# Patient Record
Sex: Male | Born: 1960 | Hispanic: No | Smoking: Current every day smoker
Health system: Southern US, Community
[De-identification: ages and names within clinical notes are randomized; demographics above are authoritative.]

## PROBLEM LIST (undated history)

## (undated) DIAGNOSIS — N529 Male erectile dysfunction, unspecified: Secondary | ICD-10-CM

## (undated) DIAGNOSIS — L930 Discoid lupus erythematosus: Secondary | ICD-10-CM

## (undated) DIAGNOSIS — L678 Other hair color and hair shaft abnormalities: Secondary | ICD-10-CM

## (undated) DIAGNOSIS — L738 Other specified follicular disorders: Secondary | ICD-10-CM

## (undated) DIAGNOSIS — R030 Elevated blood-pressure reading, without diagnosis of hypertension: Secondary | ICD-10-CM

## (undated) HISTORY — DX: Elevated blood-pressure reading, without diagnosis of hypertension: R03.0

## (undated) HISTORY — DX: Other specified follicular disorders: L73.8

## (undated) HISTORY — PX: NO PAST SURGERIES: SHX2092

## (undated) HISTORY — DX: Male erectile dysfunction, unspecified: N52.9

## (undated) HISTORY — DX: Discoid lupus erythematosus: L93.0

## (undated) HISTORY — DX: Other hair color and hair shaft abnormalities: L67.8

---

## 2010-02-06 ENCOUNTER — Ambulatory Visit: Payer: Self-pay | Admitting: Internal Medicine

## 2010-02-07 LAB — CONVERTED CEMR LAB
Albumin: 3.7 g/dL (ref 3.5–5.2)
Alkaline Phosphatase: 72 units/L (ref 39–117)
BUN: 13 mg/dL (ref 6–23)
Basophils Absolute: 0 10*3/uL (ref 0.0–0.1)
CO2: 26 meq/L (ref 19–32)
Calcium: 9.1 mg/dL (ref 8.4–10.5)
Cholesterol: 126 mg/dL (ref 0–200)
Creatinine, Ser: 1.1 mg/dL (ref 0.4–1.5)
Eosinophils Absolute: 0.2 10*3/uL (ref 0.0–0.7)
Glucose, Bld: 77 mg/dL (ref 70–99)
HDL: 47.8 mg/dL (ref >39.00)
Hemoglobin, Urine: NEGATIVE
Hemoglobin: 13.2 g/dL (ref 13.0–17.0)
Lymphocytes Relative: 28.9 % (ref 12.0–46.0)
MCHC: 34.6 g/dL (ref 30.0–36.0)
Neutro Abs: 2.9 10*3/uL (ref 1.4–7.7)
Nitrite: NEGATIVE
RDW: 13.1 % (ref 11.5–14.6)
Sodium: 139 meq/L (ref 135–145)
TSH: 1.72 microintl units/mL (ref 0.35–5.50)
Triglycerides: 43 mg/dL (ref 0.0–149.0)
Urobilinogen, UA: 0.2 (ref 0.0–1.0)

## 2010-02-09 ENCOUNTER — Encounter: Payer: Self-pay | Admitting: Internal Medicine

## 2010-02-09 ENCOUNTER — Ambulatory Visit: Payer: Self-pay | Admitting: Internal Medicine

## 2010-02-11 DIAGNOSIS — R030 Elevated blood-pressure reading, without diagnosis of hypertension: Secondary | ICD-10-CM

## 2010-02-11 DIAGNOSIS — I10 Essential (primary) hypertension: Secondary | ICD-10-CM | POA: Insufficient documentation

## 2010-02-11 DIAGNOSIS — N529 Male erectile dysfunction, unspecified: Secondary | ICD-10-CM

## 2010-03-08 ENCOUNTER — Telehealth: Payer: Self-pay | Admitting: Internal Medicine

## 2010-03-23 ENCOUNTER — Telehealth: Payer: Self-pay | Admitting: Internal Medicine

## 2010-04-25 ENCOUNTER — Telehealth: Payer: Self-pay | Admitting: Internal Medicine

## 2010-05-09 ENCOUNTER — Telehealth: Payer: Self-pay | Admitting: Internal Medicine

## 2010-06-07 ENCOUNTER — Telehealth: Payer: Self-pay | Admitting: Internal Medicine

## 2010-06-19 NOTE — Progress Notes (Signed)
Summary: Rx refill req  Phone Note Refill Request Message from:  Patient on April 25, 2010 3:09 PM  Refills Requested: Medication #1:  CIALIS 20 MG TABS 1/2-1 by mouth every 3 days as needed for sexual performance.   Dosage confirmed as above?Dosage Confirmed   Supply Requested: 3 months  Method Requested: Electronic Initial call taken by: Margaret Pyle, CMA,  April 25, 2010 3:09 PM  Follow-up for Phone Call        ok to fill as prev rx'd Follow-up by: Newt Lukes MD,  April 25, 2010 4:20 PM    Prescriptions: CIALIS 20 MG TABS (TADALAFIL) 1/2-1 by mouth every 3 days as needed for sexual performance  #3 x 0   Entered by:   Margaret Pyle, CMA   Authorized by:   Newt Lukes MD   Signed by:   Margaret Pyle, CMA on 04/25/2010   Method used:   [facility]        [facility]  [address] [phone]* (retail)       [address]       [place], [place]  [address]       Ph: [phone] or [phone]       Fax: [phone]   RxID:   [id number]

## 2010-06-19 NOTE — Progress Notes (Signed)
Summary: REFILL   Phone Note Call from Patient Call back at [facility] Phone [phone]   Summary of Call: Patient is requesting a call back regarding rx refills.  Initial call taken by: Lamar Sprinkles, CMA,  March 08, 2010 3:22 PM  Follow-up for Phone Call        Patient is requesting refill of cialis, [facility] [address] Follow-up by: Lamar Sprinkles, CMA,  March 08, 2010 5:21 PM  Additional Follow-up for Phone Call Additional follow up Details #1::        ok to fill as prev rx'd Additional Follow-up by: Newt Lukes MD,  March 09, 2010 9:45 AM    Additional Follow-up for Phone Call Additional follow up Details #2::    pt informed rx sent to pharmacy Follow-up by: Brenton Grills MA,  March 09, 2010 11:59 AM  Prescriptions: CIALIS 20 MG TABS (TADALAFIL) 1/2-1 by mouth every 3 days as needed for sexual performance  #3 x 0   Entered by:   Brenton Grills MA   Authorized by:   Newt Lukes MD   Signed by:   Brenton Grills MA on 03/09/2010   Method used:   [facility]        [facility]  [address] [phone][facility] (retail)       [address]       [place], [place]  [id number]       Ph: [phone] or [phone]       Fax: [phone]   RxID:   [id number]

## 2010-06-19 NOTE — Progress Notes (Signed)
  Phone Note Refill Request Message from:  Patient on March 23, 2010 2:51 PM  Refills Requested: Medication #1:  CIALIS 20 MG TABS 1/2-1 by mouth every 3 days as needed for sexual performance.   Dosage confirmed as above?Dosage Confirmed   Supply Requested: 3 months [facility] [address]   Method Requested: Electronic Initial call taken by: Margaret Pyle, CMA,  March 23, 2010 2:51 PM  Follow-up for Phone Call        ok to fill as prev rx'd Follow-up by: Newt Lukes MD,  March 23, 2010 3:06 PM    Prescriptions: CIALIS 20 MG TABS (TADALAFIL) 1/2-1 by mouth every 3 days as needed for sexual performance  #3 x 0   Entered by:   Margaret Pyle, CMA   Authorized by:   Newt Lukes MD   Signed by:   Margaret Pyle, CMA on 03/23/2010   Method used:   [facility]        [facility]  [address] [phone]* (retail)       [address]       [place], [place]  [address]       Ph: [phone] or [phone]       Fax: [phone]   RxID:   [id number]

## 2010-06-19 NOTE — Assessment & Plan Note (Signed)
Summary: NEW PT PHYSICAL--MED COST--#--PKG--STC   Vital Signs:  Patient profile:   50 year old male Height:      69 inches (175.26 cm) Weight:      180.8 pounds (82.18 kg) BMI:     26.80 O2 Sat:      98 % on Room air Temp:     99.7 degrees F (37.61 degrees C) oral Pulse rate:   76 / minute BP sitting:   142 / 78  (left arm) Cuff size:   regular  Vitals Entered By: Orlan Leavens RMA (February 09, 2010 3:12 PM)  O2 Flow:  Room air CC: New patient CPX Is Patient Diabetic? No Pain Assessment Patient in pain? no        Primary Care Provider:  Newt Lukes MD  CC:  New patient CPX.  History of Present Illness: new pt to me and our practice, here to est care patient is here today for annual physical. Patient feels well and has no complaints.   notes new ED - would like samples- cialis requested ED > 6 weeks, no new partners, no penile, no hx same  Preventive Screening-Counseling & Management  Alcohol-Tobacco     Alcohol drinks/day: <1     Alcohol Counseling: not indicated; use of alcohol is not excessive or problematic     Smoking Status: current     Tobacco Counseling: to quit use of tobacco products  Caffeine-Diet-Exercise     Diet Counseling: not indicated; diet is assessed to be healthy     Does Patient Exercise: yes     Exercise Counseling: not indicated; exercise is adequate     Depression Counseling: not indicated; screening negative for depression  Safety-Violence-Falls     Seat Belt Counseling: not indicated; patient wears seat belts     Helmet Counseling: not applicable     Firearm Counseling: not applicable     Violence Counseling: not indicated; no violence risk noted  Clinical Review Panels:  Prevention   Last PSA:  0.35 (02/06/2010)  Lipid Management   Cholesterol:  126 (02/06/2010)   LDL (bad choesterol):  70 (02/06/2010)   HDL (good cholesterol):  47.80 (02/06/2010)  CBC   WBC:  5.2 (02/06/2010)   RBC:  3.81 (02/06/2010)   Hgb:   13.2 (02/06/2010)   Hct:  38.1 (02/06/2010)   Platelets:  226.0 (02/06/2010)   MCV  100.0 (02/06/2010)   MCHC  34.6 (02/06/2010)   RDW  13.1 (02/06/2010)   PMN:  55.8 (02/06/2010)   Lymphs:  28.9 (02/06/2010)   Monos:  10.4 (02/06/2010)   Eosinophils:  4.5 (02/06/2010)   Basophil:  0.4 (02/06/2010)  Complete Metabolic Panel   Glucose:  77 (02/06/2010)   Sodium:  139 (02/06/2010)   Potassium:  3.9 (02/06/2010)   Chloride:  104 (02/06/2010)   CO2:  26 (02/06/2010)   BUN:  13 (02/06/2010)   Creatinine:  1.1 (02/06/2010)   Albumin:  3.7 (02/06/2010)   Total Protein:  6.7 (02/06/2010)   Calcium:  9.1 (02/06/2010)   Total Bili:  0.6 (02/06/2010)   Alk Phos:  72 (02/06/2010)   SGPT (ALT):  13 (02/06/2010)   SGOT (AST):  21 (02/06/2010)   Current Medications (verified): 1)  None  Allergies (verified): No Known Drug Allergies  Past History:  Past Medical History: Unremarkable  Past Surgical History: Denies surgical history  Family History: mom - 81s - hx PUD/GIB due to NSAIDS dad - expired 30y due to GSW (shot by highway patrol)  Social History: lives with mom and step dad - separated from wife since 2003 current smoker - cigars social alcohol useSmoking Status:  current Does Patient Exercise:  yes  Review of Systems       see HPI above. I have reviewed all other systems and they were negative.   Physical Exam  General:  alert, well-developed, well-nourished, and cooperative to examination.    Head:  Normocephalic and atraumatic without obvious abnormalities. No apparent alopecia or balding. Eyes:  vision grossly intact; pupils equal, round and reactive to light.  conjunctiva and lids normal.    Ears:  normal pinnae bilaterally, without erythema, swelling, or tenderness to palpation. TMs clear, without effusion, or cerumen impaction. Hearing grossly normal bilaterally  Mouth:  teeth and gums in good repair; mucous membranes moist, without lesions or ulcers.  oropharynx clear without exudate, no erythema.   Neck:  No deformities, masses, or tenderness noted. Lungs:  normal respiratory effort, no intercostal retractions or use of accessory muscles; normal breath sounds bilaterally - no crackles and no wheezes.    Heart:  normal rate, regular rhythm, no murmur, and no rub. BLE without edema. normal DP pulses and normal cap refill in all 4 extremities    Abdomen:  soft, non-tender, normal bowel sounds, no distention; no masses and no appreciable hepatomegaly or splenomegaly.   Rectal:  pt defers Genitalia:  pt defers Prostate:  pt defers Msk:  No deformity or scoliosis noted of thoracic or lumbar spine.   Neurologic:  alert & oriented X3 and cranial nerves II-XII symetrically intact.  strength normal in all extremities, sensation intact to light touch, and gait normal. speech fluent without dysarthria or aphasia; follows commands with good comprehension.  Skin:  no rashes, vesicles, ulcers, or erythema. No nodules or irregularity to palpation.  Psych:  Oriented X3, memory intact for recent and remote, normally interactive, good eye contact, not anxious appearing, not depressed appearing, and not agitated.      Impression & Recommendations:  Problem # 1:  PREVENTIVE HEALTH CARE (ICD-V70.0) Patient has been counseled on age-appropriate routine health concerns for screening and prevention. These are reviewed and up-to-date. Immunizations are up-to-date or declined. Labs and ECG reviewed. declines colo at this time Orders: EKG w/ Interpretation (93000)  Problem # 2:  ERECTILE DYSFUNCTION, ORGANIC (ICD-607.84) offered labs - pt declines testosterone level check screened for depression - negative trial cialis -to f/u if unimproved  His updated medication list for this problem includes:    Cialis 20 Mg Tabs (Tadalafil) .Marland Kitchen... 1/2-1 by mouth every 3 days as needed for sexual performance  Problem # 3:  ELEVATED BLOOD PRESSURE (ICD-796.2)  no known FH  HTN - advised on exercise, low Na and diet recheck BP 3-6 mo  BP today: 142/78  Labs Reviewed: Creat: 1.1 (02/06/2010) Chol: 126 (02/06/2010)   HDL: 47.80 (02/06/2010)   LDL: 70 (02/06/2010)   TG: 43.0 (02/06/2010)  Instructed in low sodium diet (DASH Handout) and behavior modification.    Complete Medication List: 1)  Cialis 20 Mg Tabs (Tadalafil) .... 1/2-1 by mouth every 3 days as needed for sexual performance  Patient Instructions: 1)  it was good to see you today. 2)  labs, EKG and exam look good today 3)  you should continue to cut back and stop smoking 4)  try 36h cialis as discussed - prescription and coupon given to you today -Please take as directed. Contact our office if you believe you're having problems with the medication(s).  5)  Check your Blood Pressure regularly. If it is above: 140/90 you should make an appointment. 6)  Limit your Sodium (Salt). 7)  Please schedule a follow-up appointment in Janurary 2012 to recheck blood pressure, call sooner if problems.  Prescriptions: CIALIS 20 MG TABS (TADALAFIL) 1/2-1 by mouth every 3 days as needed for sexual performance  #3 x 0   Entered and Authorized by:   Newt Lukes MD   Signed by:   Newt Lukes MD on 02/09/2010   Method used:   Print then Give to Patient   RxID:   [id number]

## 2010-06-21 NOTE — Progress Notes (Signed)
Summary: Rx refill req  Phone Note Refill Request Message from:  Patient on May 09, 2010 1:26 PM  Refills Requested: Medication #1:  CIALIS 20 MG TABS 1/2-1 by mouth every 3 days as needed for sexual performance.   Dosage confirmed as above?Dosage Confirmed   Supply Requested: 1 month  Method Requested: Electronic Initial call taken by: Steffan Caniglia Pyle, CMA,  May 09, 2010 1:27 PM  Follow-up for Phone Call        yes - refill now and give 6 refills on rx - thanks Follow-up by: Newt Lukes MD,  May 09, 2010 2:40 PM    Prescriptions: CIALIS 20 MG TABS (TADALAFIL) 1/2-1 by mouth every 3 days as needed for sexual performance  #3 x 6   Entered by:   Tayvon Culley Pyle, CMA   Authorized by:   Newt Lukes MD   Signed by:   Azia Toutant Pyle, CMA on 05/09/2010   Method used:   [facility]        [facility]  [address] [phone]* (retail)       [address]       [place], [place]  [id number]       Ph: [phone] or [phone]       Fax: [phone]   RxID:   [id number]

## 2010-06-21 NOTE — Progress Notes (Signed)
Summary: Rx refill req  Phone Note Refill Request Message from:  Patient on June 07, 2010 10:46 AM  Refills Requested: Medication #1:  CIALIS 20 MG TABS 1/2-1 by mouth every 3 days as needed for sexual performance.   Dosage confirmed as above?Dosage Confirmed   Supply Requested: 1 month  Method Requested: Electronic Initial call taken by: Margaret Pyle, CMA,  June 07, 2010 10:46 AM    Prescriptions: CIALIS 20 MG TABS (TADALAFIL) 1/2-1 by mouth every 3 days as needed for sexual performance  #3 Tablet x 2   Entered by:   Margaret Pyle, CMA   Authorized by:   Newt Lukes MD   Signed by:   Margaret Pyle, CMA on 06/07/2010   Method used:   [facility]        [facility]  [address] [phone]* (retail)       [address]       [place], [place]  [address]       Ph: [phone] or [phone]       Fax: [phone]   RxID:   [id number]

## 2010-06-22 ENCOUNTER — Telehealth: Payer: Self-pay | Admitting: Internal Medicine

## 2010-06-27 NOTE — Progress Notes (Signed)
Summary: Rx refill req  Phone Note Refill Request Message from:  Patient on June 22, 2010 12:55 PM  Refills Requested: Medication #1:  CIALIS 20 MG TABS 1/2-1 by mouth every 3 days as needed for sexual performance.   Dosage confirmed as above?Dosage Confirmed   Supply Requested: 3 months Pt is requesting Rx for #10 x 3   Method Requested: Electronic Initial call taken by: Margaret Pyle, CMA,  June 22, 2010 12:56 PM  Follow-up for Phone Call        ok to fill as requested - thanks Follow-up by: Newt Lukes MD,  June 22, 2010 1:14 PM    Prescriptions: CIALIS 20 MG TABS (TADALAFIL) 1/2-1 by mouth every 3 days as needed for sexual performance  #10 x 2   Entered by:   Margaret Pyle, CMA   Authorized by:   Newt Lukes MD   Signed by:   Margaret Pyle, CMA on 06/22/2010   Method used:   [facility]        [facility]  [address] [phone]* (retail)       [address]       [place], [place]  [address]       Ph: [phone] or [phone]       Fax: [phone]   RxID:   [id number]

## 2010-07-11 ENCOUNTER — Ambulatory Visit (INDEPENDENT_AMBULATORY_CARE_PROVIDER_SITE_OTHER): Payer: PRIVATE HEALTH INSURANCE | Admitting: Internal Medicine

## 2010-07-11 ENCOUNTER — Encounter: Payer: Self-pay | Admitting: Internal Medicine

## 2010-07-11 ENCOUNTER — Other Ambulatory Visit: Payer: Self-pay | Admitting: Internal Medicine

## 2010-07-11 ENCOUNTER — Other Ambulatory Visit: Payer: PRIVATE HEALTH INSURANCE

## 2010-07-11 DIAGNOSIS — R030 Elevated blood-pressure reading, without diagnosis of hypertension: Secondary | ICD-10-CM

## 2010-07-11 DIAGNOSIS — L738 Other specified follicular disorders: Secondary | ICD-10-CM | POA: Insufficient documentation

## 2010-07-11 DIAGNOSIS — N529 Male erectile dysfunction, unspecified: Secondary | ICD-10-CM

## 2010-07-17 NOTE — Assessment & Plan Note (Signed)
Summary: f/u appt--recheck blood pressure   Vital Signs:  Patient profile:   50 year old male Height:      69 inches (175.26 cm) Weight:      190.8 pounds (86.73 kg) O2 Sat:      98 % on Room air Temp:     98.4 degrees F (36.89 degrees C) oral Pulse rate:   55 / minute BP sitting:   112 / 70  (left arm) Cuff size:   large  Vitals Entered By: Orlan Leavens RMA (July 11, 2010 8:53 AM)  O2 Flow:  Room air CC: follow-up visit Is Patient Diabetic? No Pain Assessment Patient in pain? no        Primary Care Provider:  Newt Lukes MD  CC:  follow-up visit.  History of Present Illness: here for f/u -   elev BP last OV  - no hx same no recurrent problems  ED - continued problems with same would like samples- cialis requested and wants testosterone checked - ?vitamins no new partners, no penile lesions or discharge  also c/o scalp irritation - shaves scalp daily - inconsistent timing of razor change no drainage, no fever - no other skin lesions outside shaved area  Current Medications (verified): 1)  Cialis 20 Mg Tabs (Tadalafil) .... 1/2-1 By Mouth Every 3 Days As Needed For Sexual Performance 2)  Advil 200 Mg Tabs (Ibuprofen) .... Use As Needed 3)  Multivitamins  Tabs (Multiple Vitamin) .... Take 1 By Mouth Once Daily  Allergies (verified): No Known Drug Allergies  Past History:  Past Medical History: ED  Review of Systems  The patient denies fever, weight loss, chest pain, syncope, and headaches.         feels fatigued in past 1 mo  Physical Exam  General:  alert, well-developed, well-nourished, and cooperative to examination.    Lungs:  normal respiratory effort, no intercostal retractions or use of accessory muscles; normal breath sounds bilaterally - no crackles and no wheezes.    Heart:  normal rate, regular rhythm, no murmur, and no rub. BLE without edema. normal DP pulses and normal cap refill in all 4 extremities    Skin:  2 areas of mod  folliculitis on scalp over right frontal region and right psotauricle region - closely shaven, no ulceration, celluslits, fluctuence or closed pustules Psych:  Oriented X3, memory intact for recent and remote, normally interactive, good eye contact, not anxious appearing, not depressed appearing, and not agitated.      Impression & Recommendations:  Problem # 1:  FOLLICULITIS (ICD-704.8)  tx topical irritation with benzoyl gel - erx done instructed on need to change razor and keep washed  Orders: Prescription Created Electronically [phone])  Problem # 2:  ELEVATED BLOOD PRESSURE (ICD-796.2) normal BP today no known FH HTN - advised to cont exercise, low Na and diet recheck BP each OV and monitor same  Instructed in low sodium diet (DASH Handout) and behavior modification.    BP today: 112/70 Prior BP: 142/78 (02/09/2010)  Labs Reviewed: Creat: 1.1 (02/06/2010) Chol: 126 (02/06/2010)   HDL: 47.80 (02/06/2010)   LDL: 70 (02/06/2010)   TG: 43.0 (02/06/2010)  Instructed in low sodium diet (DASH Handout) and behavior modification.    Problem # 3:  ERECTILE DYSFUNCTION, ORGANIC (ICD-607.84)  His updated medication list for this problem includes:    Cialis 20 Mg Tabs (Tadalafil) .Marland Kitchen... 1/2-1 by mouth every 3 days as needed for sexual performance  Orders: TLB-Testosterone, Total (84403-TESTO)  testosterone level check today - tx if low screened for depression - negative cont cialis - f/u if unimproved  Complete Medication List: 1)  Cialis 20 Mg Tabs (Tadalafil) .... 1/2-1 by mouth every 3 days as needed for sexual performance 2)  Advil 200 Mg Tabs (Ibuprofen) .... Use as needed 3)  Multivitamins Tabs (Multiple vitamin) .... Take 1 by mouth once daily 4)  Benzoyl Peroxide 5 % Gel (Benzoyl peroxide) .... Apply two times a day to affected skin until clear  Patient Instructions: 1)  it was good to see you today. 2)  test(s) ordered today - your results will be called to you after  review in 48-72 hours from the time of test completion; if any changes need to be made or there are abnormal results, you will be notified at that time 3)  use antibioitc ointment to scalp irritation and use clean razors for shaving scalp to avoid further irritation 4)  Please schedule a follow-up appointment for physical and labs in Sept 2012, call sooner if problems.  Prescriptions: BENZOYL PEROXIDE 5 % GEL (BENZOYL PEROXIDE) apply two times a day to affected skin until clear  #1 x 1   Entered and Authorized by:   Newt Lukes MD   Signed by:   Newt Lukes MD on 07/11/2010   Method used:   Electronically to        [facility]  [address] [phone]* (retail)       [address]       [place], [place]  [address]       Ph: [phone] or [phone]       Fax: [phone]   RxID:   [id number]    Orders Added: 1)  TLB-Testosterone, Total [84403-TESTO] 2)  Est. Patient Level IV [27062] 3)  Prescription Created Electronically [id number]

## 2010-09-10 ENCOUNTER — Encounter: Payer: Self-pay | Admitting: Internal Medicine

## 2010-09-13 ENCOUNTER — Ambulatory Visit (INDEPENDENT_AMBULATORY_CARE_PROVIDER_SITE_OTHER): Payer: PRIVATE HEALTH INSURANCE | Admitting: Internal Medicine

## 2010-09-13 ENCOUNTER — Encounter: Payer: Self-pay | Admitting: Internal Medicine

## 2010-09-13 VITALS — BP 126/64 | HR 69 | Temp 98.0°F | Ht 69.0 in | Wt 188.8 lb

## 2010-09-13 DIAGNOSIS — L738 Other specified follicular disorders: Secondary | ICD-10-CM

## 2010-09-13 MED ORDER — MUPIROCIN 2 % EX OINT: TOPICAL_OINTMENT | CUTANEOUS | Status: AC

## 2010-09-13 MED ORDER — SULFAMETHOXAZOLE-TRIMETHOPRIM 800-160 MG PO TABS: 1.0000 | ORAL_TABLET | Freq: Two times a day (BID) | ORAL | Status: AC

## 2010-09-13 NOTE — Progress Notes (Signed)
  Subjective:    Patient ID: Kyle Morse, male    DOB: June 07, 1960, 50 y.o.   MRN: [id number]  HPI  here for new skin eruption Involves red bumps across chest Onset 3-4 weeks ago Hx similar eruption on scalp - improved after use of benzoyl gel but still few present on scalp Changes razors regularly associated with itch, few are tender -  "big ones" will become pustule like and rupture with pressure - then leave scar No fever No travel, new detergent or water exposure (lake, ocean or hottub)   Also reviewed chronic medical issues:  Hx elev BP but no HTN no recurrent problems No CP, HA or edema  ED - continued problems with same Improved cialis requested - reviewed prior labs: testosterone no new partners, no penile lesions or discharge   Past Medical History  Diagnosis Date  . ERECTILE DYSFUNCTION, ORGANIC   . ELEVATED BLOOD PRESSURE   . FOLLICULITIS      Review of Systems  Constitutional: Negative for fever and unexpected weight change.  Respiratory: Negative for cough.   Cardiovascular: Negative for leg swelling.  Neurological: Negative for syncope and headaches.       Objective:   Physical Exam  Constitutional: He is oriented to person, place, and time. He appears well-developed and well-nourished. No distress.  Cardiovascular: Normal rate, regular rhythm and normal heart sounds.   Pulmonary/Chest: Effort normal and breath sounds normal. No respiratory distress. He has no wheezes.  Neurological: He is alert and oriented to person, place, and time.  Skin:       Folliculitis changes on B anterior chest wall - fewer on scalp (scars from prior eruption present on arms)       Lab Results  Component Value Date   WBC 5.2 02/06/2010   HGB 13.2 02/06/2010   HCT 38.1* 02/06/2010   PLT 226.0 02/06/2010   CHOL 126 02/06/2010   TRIG 43.0 02/06/2010   HDL 47.80 02/06/2010   ALT 13 02/06/2010   AST 21 02/06/2010   NA 139 02/06/2010   K 3.9 02/06/2010   CL 104 02/06/2010   CREATININE 1.1 02/06/2010   BUN 13 02/06/2010   CO2 26 02/06/2010   TSH 1.72 02/06/2010   PSA 0.35 02/06/2010    Assessment & Plan:  Recurrent folliculitis - will treat with oral and topical abx given severity of current outbreak Reminded again to avoid regular shaving and change razor regularly

## 2010-09-13 NOTE — Patient Instructions (Signed)
It was good to see you today. Will use antibiotic pills and ointment as discussed to clear your skin infection - Your prescription(s) have been submitted to your pharmacy. Please take as directed and contact our office if you believe you are having problem(s) with the medication(s). Change razors regularly, use antibacterial soap and avoid drying skin Call if unimproved with treatment or sooner if other problems

## 2010-09-13 NOTE — Assessment & Plan Note (Signed)
Treat with septra and mupirocin topical - erx done

## 2010-10-02 ENCOUNTER — Telehealth: Payer: Self-pay

## 2010-10-02 DIAGNOSIS — L739 Follicular disorder, unspecified: Secondary | ICD-10-CM

## 2010-10-02 NOTE — Telephone Encounter (Signed)
Will refer to derm - thanks

## 2010-10-02 NOTE — Telephone Encounter (Signed)
Pt called stating that bumps on his chest and head have not improved and he has been doing everything as recommended. Pt is requesting referral to Derm for eval and treat, please advise

## 2010-10-02 NOTE — Telephone Encounter (Signed)
Pt advised and will expect a call from [facility] with appt info 

## 2010-11-19 ENCOUNTER — Encounter: Payer: Self-pay | Admitting: Internal Medicine

## 2011-03-08 ENCOUNTER — Other Ambulatory Visit: Payer: Self-pay

## 2011-03-08 MED ORDER — TADALAFIL 20 MG PO TABS: 20.0000 mg | ORAL_TABLET | Freq: Every day | ORAL | Status: DC | PRN

## 2011-11-01 ENCOUNTER — Other Ambulatory Visit: Payer: Self-pay | Admitting: Internal Medicine

## 2011-11-08 ENCOUNTER — Other Ambulatory Visit: Payer: Self-pay | Admitting: Internal Medicine

## 2012-06-08 ENCOUNTER — Emergency Department (HOSPITAL_COMMUNITY)
Admission: EM | Admit: 2012-06-08 | Discharge: 2012-06-08 | Disposition: A | Discharge status: Home / Self Care | Payer: PRIVATE HEALTH INSURANCE | Source: Home / Self Care | Attending: Family Medicine | Admitting: Family Medicine

## 2012-06-08 ENCOUNTER — Encounter (HOSPITAL_COMMUNITY): Payer: Self-pay | Admitting: *Deleted

## 2012-06-08 DIAGNOSIS — H109 Unspecified conjunctivitis: Secondary | ICD-10-CM

## 2012-06-08 MED ORDER — MOXIFLOXACIN HCL 0.5 % OP SOLN: 1.0000 [drp] | Freq: Three times a day (TID) | OPHTHALMIC | Status: DC

## 2012-06-08 NOTE — ED Provider Notes (Signed)
History     CSN: [id number]  Arrival date & time 06/08/12  4098   First MD Initiated Contact with Patient 06/08/12 1824      No chief complaint on file.   (Consider location/radiation/quality/duration/timing/severity/associated sxs/prior treatment) Patient is a 52 y.o. male presenting with eye problem. The history is provided by the patient.  Eye Problem  This is a new problem. The current episode started more than 2 days ago. The problem has been gradually worsening. The left eye is affected.There was no injury mechanism. The pain is mild. There is no history of trauma to the eye. There is no known exposure to pink eye. He does not wear contacts. Associated symptoms include foreign body sensation and eye redness. Pertinent negatives include no numbness, no blurred vision, no decreased vision, no discharge, no double vision, no photophobia and no nausea.    Past Medical History  Diagnosis Date  . ERECTILE DYSFUNCTION, ORGANIC   . ELEVATED BLOOD PRESSURE   . FOLLICULITIS     Past Surgical History  Procedure Date  . No past surgeries     No family history on file.  History  Substance Use Topics  . Smoking status: Current Every Day Smoker    Types: Cigars  . Smokeless tobacco: Not on file     Comment: separated from wife since 2003. lives with mom & step dad  . Alcohol Use: Yes     Comment: Social      Review of Systems  Constitutional: Negative.   HENT: Negative.   Eyes: Positive for redness. Negative for blurred vision, double vision, photophobia and discharge.  Gastrointestinal: Negative for nausea.  Neurological: Negative for numbness.    Allergies  Review of patient's allergies indicates no known allergies.  Home Medications   Current Outpatient Rx  Name  Route  Sig  Dispense  Refill  . BENZOYL PEROXIDE 5 % EX GEL   Topical   Apply topically 2 (two) times daily.           Marland Kitchen CIALIS 20 MG PO TABS      TAKE 1 TABLET (20 MG TOTAL) BY MOUTH DAILY AS  NEEDED.   10 tablet   0     Overdue for yearly physical must see md b4 additio ...   . IBUPROFEN 200 MG PO TABS   Oral   Take 200 mg by mouth as needed.           Marland Kitchen ONE-DAILY MULTI VITAMINS PO TABS   Oral   Take 1 tablet by mouth daily.             BP 144/84  Pulse 86  Temp 98.1 F (36.7 C) (Oral)  Resp 18  SpO2 98%  Physical Exam  Nursing note and vitals reviewed. Constitutional: He appears well-developed and well-nourished.  HENT:  Head: Normocephalic.  Right Ear: External ear normal.  Mouth/Throat: Oropharynx is clear and moist.  Eyes: EOM and lids are normal. Pupils are equal, round, and reactive to light. No foreign bodies found. Left conjunctiva is injected. Left conjunctiva has no hemorrhage.  Fundoscopic exam:      The left eye shows no exudate. The left eye shows venous pulsations. Slit lamp exam:      The left eye shows no fluorescein uptake.      ED Course  Procedures (including critical care time)  Labs Reviewed - No data to display No results found.   No diagnosis found.    MDM  Linna Hoff, MD 06/08/12 Jerene Bears

## 2012-06-08 NOTE — ED Notes (Signed)
Fri afternoon he started having left eye pain, red and swollen.  During the night he felt pressure building up in his eye and tearing up.  Saturday AM it was OK, it was still red and sensitive but not as bad.  Sunday afternoon he had photophobia and sensitive to the air.  Sun. night he had the same problem her had Fri. night.

## 2013-05-30 ENCOUNTER — Ambulatory Visit (INDEPENDENT_AMBULATORY_CARE_PROVIDER_SITE_OTHER): Payer: PRIVATE HEALTH INSURANCE | Admitting: Family Medicine

## 2013-05-30 ENCOUNTER — Ambulatory Visit: Payer: PRIVATE HEALTH INSURANCE

## 2013-05-30 VITALS — BP 120/72 | HR 90 | Temp 98.1°F | Resp 18 | Ht 68.0 in | Wt 202.0 lb

## 2013-05-30 DIAGNOSIS — T148XXA Other injury of unspecified body region, initial encounter: Secondary | ICD-10-CM

## 2013-05-30 DIAGNOSIS — R202 Paresthesia of skin: Secondary | ICD-10-CM

## 2013-05-30 DIAGNOSIS — M25531 Pain in right wrist: Secondary | ICD-10-CM

## 2013-05-30 DIAGNOSIS — M25539 Pain in unspecified wrist: Secondary | ICD-10-CM

## 2013-05-30 DIAGNOSIS — R209 Unspecified disturbances of skin sensation: Secondary | ICD-10-CM

## 2013-05-30 LAB — POCT GLYCOSYLATED HEMOGLOBIN (HGB A1C): Hemoglobin A1C: 5.3

## 2013-05-30 MED ORDER — MELOXICAM 7.5 MG PO TABS: 7.5000 mg | ORAL_TABLET | Freq: Two times a day (BID) | ORAL | Status: DC

## 2013-05-30 NOTE — Patient Instructions (Signed)
Wrist Pain Wrist injuries are frequent in adults and children. A sprain is an injury to the ligaments that hold your bones together. A strain is an injury to muscle or muscle cord-like structures (tendons) from stretching or pulling. Generally, when wrists are moderately tender to touch following a fall or injury, a break in the bone (fracture) may be present. Most wrist sprains or strains are better in 3 to 5 days, but complete healing may take several weeks. HOME CARE INSTRUCTIONS   Put ice on the injured area.  Put ice in a plastic bag.  Place a towel between your skin and the bag.  Leave the ice on for 15-20 minutes, 03-04 times a day, for the first 2 days.  Keep your arm raised above the level of your heart whenever possible to reduce swelling and pain.  Rest the injured area for at least 48 hours or as directed by your caregiver.  If a splint or elastic bandage has been applied, use it for as long as directed by your caregiver or until seen by a caregiver for a follow-up exam.  Only take over-the-counter or prescription medicines for pain, discomfort, or fever as directed by your caregiver.  Keep all follow-up appointments. You may need to follow up with a specialist or have follow-up X-rays. Improvement in pain level is not a guarantee that you did not fracture a bone in your wrist. The only way to determine whether or not you have a broken bone is by X-ray. SEEK IMMEDIATE MEDICAL CARE IF:   Your fingers are swollen, very red, white, or cold and blue.  Your fingers are numb or tingling.  You have increasing pain.  You have difficulty moving your fingers. MAKE SURE YOU:   Understand these instructions.  Will watch your condition.  Will get help right away if you are not doing well or get worse. Document Released: 02/13/2005 Document Revised: 07/29/2011 Document Reviewed: 06/27/2010 [employer] Patient Information 2014 [employer].  

## 2013-05-30 NOTE — Progress Notes (Signed)
Chief Complaint:  Chief Complaint  Patient presents with  . wrist pain and numbness in finger    right-x1 week last night worse     HPI: Kyle Morse is a 53 y.o. male who is here for right sided wrist soreness. The pain radiates up to his right shoulder. It is in both wrists. At night he feels some numbness and tingling in his fingers. He states that some of his fingers on his right hand are swollen also. His symptoms started about a week ago and has been getting worse since then. Last night the pain was at its worst. 8/10. He takes Advil with some relief but not much. 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 He also has experienced some weakness in his hands. Throbbing aching sharp pain, wakes him up.   Past Medical History  Diagnosis Date  . ERECTILE DYSFUNCTION, ORGANIC   . ELEVATED BLOOD PRESSURE   . FOLLICULITIS    Past Surgical History  Procedure Laterality Date  . No past surgeries     History   Social History  . Marital Status: Legally Separated    Spouse Name: N/A    Number of Children: N/A  . Years of Education: N/A   Social History Main Topics  . Smoking status: Current Every Day Smoker -- 1.00 packs/day    Types: Cigars  . Smokeless tobacco: None     Comment: separated from wife since 2003. lives with mom & step dad  . Alcohol Use: 16.8 oz/week    14 Cans of beer, 14 Shots of liquor per week     Comment: Social  . Drug Use: No  . Sexual Activity:    Other Topics Concern  . None   Social History Narrative  . None   History reviewed. No pertinent family history. No Known Allergies Prior to Admission medications   Medication Sig Start Date End Date Taking? Authorizing Provider  ibuprofen (ADVIL,MOTRIN) 200 MG tablet Take 200 mg by mouth as needed.     Yes Historical Provider, MD  Multiple Vitamin (MULTIVITAMIN) tablet Take 1 tablet by mouth daily.     Yes Historical Provider, MD  benzoyl peroxide 5 % gel Apply  topically 2 (two) times daily.      Historical Provider, MD  CIALIS 20 MG tablet TAKE 1 TABLET (20 MG TOTAL) BY MOUTH DAILY AS NEEDED. 11/01/11   Newt LukesValerie A Leschber, MD  moxifloxacin (VIGAMOX) 0.5 % ophthalmic solution Place 1 drop into the left eye 3 (three) times daily. After warm soak to eye. 06/08/12   Linna HoffJames D Kindl, MD     ROS: The patient denies fevers, chills, night sweats, unintentional weight loss, chest pain, palpitations, wheezing, dyspnea on exertion, nausea, vomiting, abdominal pain, dysuria, hematuria, melena  All other systems have been reviewed and were otherwise negative with the exception of those mentioned in the HPI and as above.    PHYSICAL EXAM: Filed Vitals:   05/30/13 1201  BP: 120/72  Pulse: 90  Temp: 98.1 F (36.7 C)  Resp: 18   Filed Vitals:   05/30/13 1201  Height: 5\' 8"  (1.727 m)  Weight: 202 lb (91.627 kg)   Body mass index is 30.72 kg/(m^2).  General: Alert, no acute distress HEENT:  Normocephalic, atraumatic, oropharynx patent. EOMI, PERRLA Cardiovascular:  Regular rate and rhythm, no rubs murmurs or gallops.  No Carotid bruits, radial pulse intact. No pedal edema.  Respiratory: Clear  to auscultation bilaterally.  No wheezes, rales, or rhonchi.  No cyanosis, no use of accessory musculature GI: No organomegaly, abdomen is soft and non-tender, positive bowel sounds.  No masses. Skin: No rashes. Neurologic: Facial musculature symmetric. Psychiatric: Patient is appropriate throughout our interaction. Lymphatic: No cervical lymphadenopathy Musculoskeletal: Gait intact. Right neck-normal, neg spurling Right shoulder-neg Hawkin/Neers, full ROM Right wrist-full ROM, sensation intact. 5/5 strength, + Phalens, + tinels, Neg Dequervain     LABS: Results for orders placed in visit on 05/30/13  POCT GLYCOSYLATED HEMOGLOBIN (HGB A1C)      Result Value Range   Hemoglobin A1C 5.3       EKG/XRAY:   Primary read interpreted by Dr. Conley Rolls at [facility]. No  fx/dislocation   ASSESSMENT/PLAN: Encounter Diagnoses  Name Primary?  . Wrist pain, right Yes  . Paresthesias in right hand   . 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 NKI.  I think it might be strain and strain with possible  carpal tunnel component from overuse and repetitive motion. He does not have diabetes. He is a smoker. Denies PAD .  Rx mobic and wrist brace He will see his PCP this Friday , advise to let her know if  no improvement then consider steroid taper or tramadol F/u prn Gross sideeffects, risk and benefits, and alternatives of medications d/w patient. Patient is aware that all medications have potential sideeffects and we are unable to predict every sideeffect or drug-drug interaction that may occur.  LE, [name], DO 05/30/2013 1:21 PM   05/31/13--Spoke with patient, he had pain that woke him up from sleep, he was wearing his wrist brace. Advise to stop wearing wrist brace at night, use just when working. I rx him tramadol since he is having more pain. D/w pt risks and benefits.  Deneis numbness/weakness or tingling. He will F/u with Dr Rene Paci on Friday.

## 2013-05-31 ENCOUNTER — Telehealth: Payer: Self-pay

## 2013-05-31 MED ORDER — TRAMADOL HCL 50 MG PO TABS: 50.0000 mg | ORAL_TABLET | Freq: Every evening | ORAL | Status: DC | PRN

## 2013-05-31 NOTE — Telephone Encounter (Signed)
Patient states that if the pain in his wrist has not improved he was instructed to call back to get a pain medicaiton. [facility] [address]  [phone]

## 2013-05-31 NOTE — Telephone Encounter (Signed)
In OV states pt may try Tramadol or Steroid taper if not feeling better. Pain has been unchanged from visit yesterday. Please advise.

## 2013-06-01 NOTE — Telephone Encounter (Signed)
States that the Tramadol is not working for him. States that he woke up at 2:30am with is fingers throbbing.  [phone]

## 2013-06-02 NOTE — Telephone Encounter (Signed)
Pt is going to try taking Advil in conjunction with Tramadol to help with the pain relief. He has a dr appt for follow up on Friday.

## 2013-06-04 ENCOUNTER — Encounter: Payer: Self-pay | Admitting: Internal Medicine

## 2013-06-04 ENCOUNTER — Other Ambulatory Visit (INDEPENDENT_AMBULATORY_CARE_PROVIDER_SITE_OTHER): Payer: PRIVATE HEALTH INSURANCE

## 2013-06-04 ENCOUNTER — Ambulatory Visit (INDEPENDENT_AMBULATORY_CARE_PROVIDER_SITE_OTHER): Payer: PRIVATE HEALTH INSURANCE | Admitting: Family Medicine

## 2013-06-04 ENCOUNTER — Encounter: Payer: Self-pay | Admitting: Family Medicine

## 2013-06-04 ENCOUNTER — Ambulatory Visit (INDEPENDENT_AMBULATORY_CARE_PROVIDER_SITE_OTHER): Payer: PRIVATE HEALTH INSURANCE | Admitting: Internal Medicine

## 2013-06-04 VITALS — BP 112/70 | HR 87 | Temp 97.7°F | Resp 16 | Wt 203.0 lb

## 2013-06-04 VITALS — BP 112/70 | HR 87 | Temp 97.7°F | Ht 69.0 in | Wt 203.0 lb

## 2013-06-04 DIAGNOSIS — M79641 Pain in right hand: Secondary | ICD-10-CM

## 2013-06-04 DIAGNOSIS — M79609 Pain in unspecified limb: Secondary | ICD-10-CM

## 2013-06-04 DIAGNOSIS — G5600 Carpal tunnel syndrome, unspecified upper limb: Secondary | ICD-10-CM

## 2013-06-04 DIAGNOSIS — M25539 Pain in unspecified wrist: Secondary | ICD-10-CM

## 2013-06-04 DIAGNOSIS — M25531 Pain in right wrist: Secondary | ICD-10-CM

## 2013-06-04 MED ORDER — GABAPENTIN 300 MG PO CAPS: ORAL_CAPSULE | ORAL | Status: DC

## 2013-06-04 MED ORDER — MELOXICAM 15 MG PO TABS: 15.0000 mg | ORAL_TABLET | Freq: Every day | ORAL | Status: DC

## 2013-06-04 MED ORDER — KETOROLAC TROMETHAMINE 60 MG/2ML IM SOLN
60.0000 mg | Freq: Once | INTRAMUSCULAR | Status: AC
  Administered 2013-06-04: 60 mg via INTRAMUSCULAR

## 2013-06-04 NOTE — Progress Notes (Signed)
Pre-visit discussion using our clinic review tool. No additional management support is needed unless otherwise documented below in the visit note.  

## 2013-06-04 NOTE — Progress Notes (Signed)
I'm seeing this patient by the request  of:  Rene PaciValerie Leschber, MD   CC: Right wrist pain  HPI: Patient is a very pleasant 53 year old gentleman is coming in with multiple months duration of right wrist pain. 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 Patient states that he is actually having more numbness in points to more of the median nerve distribution. Patient states he is also having swelling of his hand which is new. Patient denies any weakness but states that the pain can wake him up at night. Patient rates the pain as 9/10 in severity when it does hurt. Patient has an urgent care and was given a brace he was going to attempt to wear at night but he is unfortunately unable to because he states that it causes a throbbing sensation. Patient has been told by other individuals such as friends that he would need surgery. Patient is here for evaluation and management if possible.   Past medical, surgical, family and social history reviewed. Medications reviewed all in the electronic medical record.   Review of Systems: No headache, visual changes, nausea, vomiting, diarrhea, constipation, dizziness, abdominal pain, skin rash, fevers, chills, night sweats, weight loss, swollen lymph nodes, body aches, joint swelling, muscle aches, chest pain, shortness of breath, mood changes.   Objective:    Blood pressure 112/70, pulse 87, temperature 97.7 F (36.5 C), temperature source Oral, resp. rate 16, weight 203 lb (92.08 kg), SpO2 98.00%.   General: No apparent distress alert and oriented x3 mood and affect normal, dressed appropriately.  HEENT: Pupils equal, extraocular movements intact Respiratory: Patient's speak in full sentences and does not appear short of breath Cardiovascular: No lower extremity edema, non tender, no erythema Skin: Warm dry intact with no signs of infection or rash on extremities or on axial skeleton. Abdomen: Soft nontender Neuro: Cranial nerves II  through XII are intact, neurovascularly intact in all extremities with 2+ DTRs and 2+ pulses. Lymph: No lymphadenopathy of posterior or anterior cervical chain or axillae bilaterally.  Gait normal with good balance and coordination.  MSK: Non tender with full range of motion and good stability and symmetric strength and tone of shoulders, elbows, hip, knee and ankles bilaterally.  Wrist: Right Inspection shows the patient does have some swelling of this right hand compared to the contralateral side. ROM smooth and normal with good flexion and extension and ulnar/radial deviation that is symmetrical with opposite wrist. Palpation is normal over metacarpals, navicular, lunate, and TFCC; tendons without tenderness/ swelling No snuffbox tenderness. No tenderness over Canal of Guyon. Strength 5/5 in all directions without pain. Negative Finkelstein,  POSITIVE tinel's and phalens. Negative Watson's test.  MSK US performed of: Right wrist This study was ordered, performed, and interpreted by Terrilee FilesZach Smith D.O.  Wrist: All extensor compartments visualized and tendons all normal in appearance without fraying, tears, or sheath effusions. No effusion seen. TFCC intact. Scapholunate ligament intact. Carpal tunnel visualized but does have significant scarring of the nerve sheath surrounding the area and does have an area of 2.09 cm  Power doppler signal normal.  IMPRESSION:  Carpal tunnel syndrome  After verbal consent patient was prepped with alcohol swabs and then with a 25-gauge 5 inch inch needle was injected with 1 cc of 0.5% Marcaine and 1 cc of Kenalog 40 mg/dL. This was done under ultrasound guidance and needle placement was correct. Patient tolerated the procedure well and did have numbness but did have complete resolution of pain..Marland Kitchen  Impression and Recommendations:     This case required medical decision making of moderate complexity.

## 2013-06-04 NOTE — Patient Instructions (Signed)
Very nice to meet you If you can wear brace day and night for next 2 weeks that would be ideal Meloxicam daily for 10 days then as needed Gabapentin nightly Exercises most days of the week.  Come back in 2 weeks.

## 2013-06-04 NOTE — Patient Instructions (Signed)

## 2013-06-04 NOTE — Progress Notes (Signed)
   Subjective:    Patient ID: Kyle Morse, male    DOB: [birth year], 53 y.o.   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 ERECTILE DYSFUNCTION, ORGANIC   . ELEVATED BLOOD PRESSURE   . FOLLICULITIS     Review of Systems  Constitutional: Negative for fever and fatigue.  Musculoskeletal: Negative for arthralgias, joint swelling and myalgias.  Neurological: Positive for weakness (right hand). Negative for tremors and facial asymmetry.       Objective:   Physical Exam BP 112/70  Pulse 87  Temp(Src) 97.7 F (36.5 C) (Oral)  Ht 5\' 9"  (1.753 m)  Wt 203 lb (92.08 kg)  BMI 29.96 kg/m2  SpO2 98% Wt Readings from Last 3 Encounters:  06/04/13 203 lb (92.08 kg)  05/30/13 202 lb (91.627 kg)  09/13/10 188 lb 12.8 oz (85.639 kg)   Gen: uncomfortable but no acute distress MSkel: soft tissue swelling of right hand namely in thumb, index and third finger. No synovitis over PIP/DIP MCP or wrist. Full range of motion with decreased strength of hand grip right compared to left. No atrophy or other gross deformity Neuro - decreased grip strength hand right compared to left.  Lab Results  Component Value Date   WBC 5.2 02/06/2010   HGB 13.2 02/06/2010   HCT 38.1* 02/06/2010   PLT 226.0 02/06/2010   GLUCOSE 77 02/06/2010   CHOL  126 02/06/2010   TRIG 43.0 02/06/2010   HDL 47.80 02/06/2010   LDLCALC 70 02/06/2010   ALT 13 02/06/2010   AST 21 02/06/2010   NA 139 02/06/2010   K 3.9 02/06/2010   CL 104 02/06/2010   CREATININE 1.1 02/06/2010   BUN 13 02/06/2010   CO2 26 02/06/2010   TSH 1.72 02/06/2010   PSA 0.35 02/06/2010   HGBA1C 5.3 05/30/2013      Assessment & Plan:  Severe right hand pain x2 weeks Symptoms worse at night awaking with exacerbation of pain and numbness in median distribution Now with neurogenic swelling over thumb, index and middle finger  Toradol 60 mg IM for acute pain and anti-inflammatory Arrange for US eval today by sports med to consider steroid injection as patient is reluctant to consider surgery Patient educated on nature of carpal tunnel and importance of wearing wrist brace, especially at night  Spoke in person with Dr. Ayesha MohairZack Smith who will see patient and treat today as outlined

## 2013-06-04 NOTE — Addendum Note (Signed)
Addended by: Rene PaciLESCHBER, Tiffanie Blassingame A on: 06/04/2013 01:13 PM   Modules accepted: Orders

## 2013-06-04 NOTE — Assessment & Plan Note (Signed)
oInjected today.  Icing rotocol Wrist brace 23 hours a day for next 2 weeks.  Medications per orders. Patient will followup in 3 weeks for further evaluation. At that time if he is doing better we will do a bracing only at night. He can avoid nerve conduction study at this time.

## 2013-06-16 ENCOUNTER — Encounter: Payer: Self-pay | Admitting: Internal Medicine

## 2013-06-16 ENCOUNTER — Ambulatory Visit (INDEPENDENT_AMBULATORY_CARE_PROVIDER_SITE_OTHER)
Admission: RE | Admit: 2013-06-16 | Discharge: 2013-06-16 | Disposition: A | Discharge status: Home / Self Care | Payer: PRIVATE HEALTH INSURANCE | Source: Ambulatory Visit | Attending: Internal Medicine | Admitting: Internal Medicine

## 2013-06-16 ENCOUNTER — Ambulatory Visit (INDEPENDENT_AMBULATORY_CARE_PROVIDER_SITE_OTHER): Payer: PRIVATE HEALTH INSURANCE | Admitting: Internal Medicine

## 2013-06-16 VITALS — BP 128/80 | HR 98 | Temp 98.2°F

## 2013-06-16 DIAGNOSIS — M7541 Impingement syndrome of right shoulder: Secondary | ICD-10-CM

## 2013-06-16 DIAGNOSIS — M7580 Other shoulder lesions, unspecified shoulder: Secondary | ICD-10-CM

## 2013-06-16 DIAGNOSIS — M25819 Other specified joint disorders, unspecified shoulder: Secondary | ICD-10-CM

## 2013-06-16 DIAGNOSIS — G5600 Carpal tunnel syndrome, unspecified upper limb: Secondary | ICD-10-CM

## 2013-06-16 MED ORDER — HYDROCODONE-ACETAMINOPHEN 5-325 MG PO TABS: 1.0000 | ORAL_TABLET | Freq: Every evening | ORAL | Status: DC | PRN

## 2013-06-16 MED ORDER — MELOXICAM 15 MG PO TABS: 15.0000 mg | ORAL_TABLET | Freq: Every day | ORAL | Status: DC

## 2013-06-16 NOTE — Progress Notes (Signed)
Pre-visit discussion using our clinic review tool. No additional management support is needed unless otherwise documented below in the visit note.  

## 2013-06-16 NOTE — Progress Notes (Signed)
   Subjective:    Patient ID: Kyle Morse, male    DOB: 1961-01-11, 53 y.o.   MRN: [id number]  HPI  Here for follow up R hand and shoulder pain Interval hx reviewed Pain uncontrolled at night in shoulder but somewhat improved in R hand since injection  Past Medical History  Diagnosis Date  . ERECTILE DYSFUNCTION, ORGANIC   . ELEVATED BLOOD PRESSURE   . FOLLICULITIS     Review of Systems  Constitutional: Negative for fever and fatigue.  Musculoskeletal: Negative for arthralgias, joint swelling and myalgias.  Neurological: Positive for weakness (right hand). Negative for tremors and facial asymmetry.       Objective:   Physical Exam BP 128/80  Pulse 98  Temp(Src) 98.2 F (36.8 C) (Oral)  SpO2 95% Wt Readings from Last 3 Encounters:  06/04/13 203 lb (92.08 kg)  06/04/13 203 lb (92.08 kg)  05/30/13 202 lb (91.627 kg)   Gen: uncomfortable but no acute distress MSkel: improved soft tissue swelling of right hand namely in thumb, index and third finger. No synovitis over PIP/DIP MCP or wrist. Full range of motion with decreased strength of hand grip right compared to left. No atrophy or other gross deformity R Shoulder: Full range of motion. Neurovascularly intact distally. Good strength with stress of rotator cuff but causes pain. Positive impingement signs. Neuro - decreased grip strength hand right compared to left.  Lab Results  Component Value Date   WBC 5.2 02/06/2010   HGB 13.2 02/06/2010   HCT 38.1* 02/06/2010   PLT 226.0 02/06/2010   GLUCOSE 77 02/06/2010   CHOL 126 02/06/2010   TRIG 43.0 02/06/2010   HDL 47.80 02/06/2010   LDLCALC 70 02/06/2010   ALT 13 02/06/2010   AST 21 02/06/2010   NA 139 02/06/2010   K 3.9 02/06/2010   CL 104 02/06/2010   CREATININE 1.1 02/06/2010   BUN 13 02/06/2010   CO2 26 02/06/2010   TSH 1.72 02/06/2010   PSA 0.35 02/06/2010   HGBA1C 5.3 05/30/2013      Assessment & Plan:   Severe R CTS -  improved s/p steroid injection by sport med 2 weeks  ago Stable nocturnal symptoms using brace  R shoulder impingement - night awaking with exacerbation Check plain film to exclude bone spur Good strength on exam, but concern for impingement versus possible tear Given strengthening exercises for home PT, to continue anti-inflammatories  Followup with sports medicine on both issues in 2 weeks if unimproved, call sooner if worse  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 Patient will consider and call if work note needed

## 2013-06-16 NOTE — Patient Instructions (Addendum)
It was good to see you today.  We have reviewed your prior records including labs and tests today  Xray R shoulder ordered today. Your results will be released to MyChart (or called to you) after review, usually within 72hours after test completion. If any changes need to be made, you will be notified at that same time.  Continue meloxicam every day for shoulder and wrist pain, continue night time wrist splint for support  If shoulder or hand symptoms unimproved and another 2 weeks, please followup with Dr. Ayesha Mohair for further evaluation and treatment as needed  Impingement Syndrome, Rotator Cuff, Bursitis with Rehab Impingement syndrome is a condition that involves inflammation of the tendons of the rotator cuff and the subacromial bursa, that causes pain in the shoulder. The rotator cuff consists of four tendons and muscles that control much of the shoulder and upper arm function. The subacromial bursa is a fluid filled sac that helps reduce friction between the rotator cuff and one of the bones of the shoulder (acromion). Impingement syndrome is usually an overuse injury that causes swelling of the bursa (bursitis), swelling of the tendon (tendonitis), and/or a tear of the tendon (strain). Strains are classified into three categories. Grade 1 strains cause pain, but the tendon is not lengthened. Grade 2 strains include a lengthened ligament, due to the ligament being stretched or partially ruptured. With grade 2 strains there is still function, although the function may be decreased. Grade 3 strains include a complete tear of the tendon or muscle, and function is usually impaired. SYMPTOMS   Pain around the shoulder, often at the outer portion of the upper arm.  Pain that gets worse with shoulder function, especially when reaching overhead or lifting.  Sometimes, aching when not using the arm.  Pain that wakes you up at night.  Sometimes, tenderness, swelling, warmth, or redness over the  affected area.  Loss of strength.  Limited motion of the shoulder, especially reaching behind the back (to the back pocket or to unhook bra) or across your body.  Crackling sound (crepitation) when moving the arm.  Biceps tendon pain and inflammation (in the front of the shoulder). Worse when bending the elbow or lifting. CAUSES  Impingement syndrome is often an overuse injury, in which chronic (repetitive) motions cause the tendons or bursa to become inflamed. A strain occurs when a force is paced on the tendon or muscle that is greater than it can withstand. Common mechanisms of injury include: Stress from sudden increase in duration, frequency, or intensity of training.  Direct hit (trauma) to the shoulder.  Aging, erosion of the tendon with normal use.  Bony bump on shoulder (acromial spur). RISK INCREASES WITH:  Contact sports (football, wrestling, boxing).  Throwing sports (baseball, tennis, volleyball).  Weightlifting and bodybuilding.  Heavy labor.  Previous injury to the rotator cuff, including impingement.  Poor shoulder strength and flexibility.  Failure to warm up properly before activity.  Inadequate protective equipment.  Old age.  Bony bump on shoulder (acromial spur). PREVENTION   Warm up and stretch properly before activity.  Allow for adequate recovery between workouts.  Maintain physical fitness:  Strength, flexibility, and endurance.  Cardiovascular fitness.  Learn and use proper exercise technique. PROGNOSIS  If treated properly, impingement syndrome usually goes away within 6 weeks. Sometimes surgery is required.  RELATED COMPLICATIONS   Longer healing time if not properly treated, or if not given enough time to heal.  Recurring symptoms, that result in a chronic  condition.  Shoulder stiffness, frozen shoulder, or loss of motion.  Rotator cuff tendon tear.  Recurring symptoms, especially if activity is resumed too soon, with  overuse, with a direct blow, or when using poor technique. TREATMENT  Treatment first involves the use of ice and medicine, to reduce pain and inflammation. The use of strengthening and stretching exercises may help reduce pain with activity. These exercises may be performed at home or with a therapist. If non-surgical treatment is unsuccessful after more than 6 months, surgery may be advised. After surgery and rehabilitation, activity is usually possible in 3 months.  MEDICATION  If pain medicine is needed, nonsteroidal anti-inflammatory medicines (aspirin and ibuprofen), or other minor pain relievers (acetaminophen), are often advised.  Do not take pain medicine for 7 days before surgery.  Prescription pain relievers may be given, if your caregiver thinks they are needed. Use only as directed and only as much as you need.  Corticosteroid injections may be given by your caregiver. These injections should be reserved for the most serious cases, because they may only be given a certain number of times. HEAT AND COLD  Cold treatment (icing) should be applied for 10 to 15 minutes every 2 to 3 hours for inflammation and pain, and immediately after activity that aggravates your symptoms. Use ice packs or an ice massage.  Heat treatment may be used before performing stretching and strengthening activities prescribed by your caregiver, physical therapist, or athletic trainer. Use a heat pack or a warm water soak. SEEK MEDICAL CARE IF:   Symptoms get worse or do not improve in 4 to 6 weeks, despite treatment.  New, unexplained symptoms develop. (Drugs used in treatment may produce side effects.) EXERCISES  RANGE OF MOTION (ROM) AND STRETCHING EXERCISES - Impingement Syndrome (Rotator Cuff  Tendinitis, Bursitis) These exercises may help you when beginning to rehabilitate your injury. Your symptoms may go away with or without further involvement from your physician, physical therapist or athletic  trainer. While completing these exercises, remember:   Restoring tissue flexibility helps normal motion to return to the joints. This allows healthier, less painful movement and activity.  An effective stretch should be held for at least 30 seconds.  A stretch should never be painful. You should only feel a gentle lengthening or release in the stretched tissue. STRETCH  Flexion, Standing  Stand with good posture. With an underhand grip on your right / left hand, and an overhand grip on the opposite hand, grasp a broomstick or cane so that your hands are a little more than shoulder width apart.  Keeping your right / left elbow straight and shoulder muscles relaxed, push the stick with your opposite hand, to raise your right / left arm in front of your body and then overhead. Raise your arm until you feel a stretch in your right / left shoulder, but before you have increased shoulder pain.  Try to avoid shrugging your right / left shoulder as your arm rises, by keeping your shoulder blade tucked down and toward your mid-back spine. Hold for __________ seconds.  Slowly return to the starting position. Repeat __________ times. Complete this exercise __________ times per day. STRETCH  Abduction, Supine  Lie on your back. With an underhand grip on your right / left hand and an overhand grip on the opposite hand, grasp a broomstick or cane so that your hands are a little more than shoulder width apart.  Keeping your right / left elbow straight and your shoulder muscles  relaxed, push the stick with your opposite hand, to raise your right / left arm out to the side of your body and then overhead. Raise your arm until you feel a stretch in your right / left shoulder, but before you have increased shoulder pain.  Try to avoid shrugging your right / left shoulder as your arm rises, by keeping your shoulder blade tucked down and toward your mid-back spine. Hold for __________ seconds.  Slowly return to  the starting position. Repeat __________ times. Complete this exercise __________ times per day. ROM  Flexion, Active-Assisted  Lie on your back. You may bend your knees for comfort.  Grasp a broomstick or cane so your hands are about shoulder width apart. Your right / left hand should grip the end of the stick, so that your hand is positioned "thumbs-up," as if you were about to shake hands.  Using your healthy arm to lead, raise your right / left arm overhead, until you feel a gentle stretch in your shoulder. Hold for __________ seconds.  Use the stick to assist in returning your right / left arm to its starting position. Repeat __________ times. Complete this exercise __________ times per day.  ROM - Internal Rotation, Supine   Lie on your back on a firm surface. Place your right / left elbow about 60 degrees away from your side. Elevate your elbow with a folded towel, so that the elbow and shoulder are the same height.  Using a broomstick or cane and your strong arm, pull your right / left hand toward your body until you feel a gentle stretch, but no increase in your shoulder pain. Keep your shoulder and elbow in place throughout the exercise.  Hold for __________ seconds. Slowly return to the starting position. Repeat __________ times. Complete this exercise __________ times per day. STRETCH - Internal Rotation  Place your right / left hand behind your back, palm up.  Throw a towel or belt over your opposite shoulder. Grasp the towel with your right / left hand.  While keeping an upright posture, gently pull up on the towel, until you feel a stretch in the front of your right / left shoulder.  Avoid shrugging your right / left shoulder as your arm rises, by keeping your shoulder blade tucked down and toward your mid-back spine.  Hold for __________ seconds. Release the stretch, by lowering your healthy hand. Repeat __________ times. Complete this exercise __________ times per  day. ROM - Internal Rotation   Using an underhand grip, grasp a stick behind your back with both hands.  While standing upright with good posture, slide the stick up your back until you feel a mild stretch in the front of your shoulder.  Hold for __________ seconds. Slowly return to your starting position. Repeat __________ times. Complete this exercise __________ times per day.  STRETCH  Posterior Shoulder Capsule   Stand or sit with good posture. Grasp your right / left elbow and draw it across your chest, keeping it at the same height as your shoulder.  Pull your elbow, so your upper arm comes in closer to your chest. Pull until you feel a gentle stretch in the back of your shoulder.  Hold for __________ seconds. Repeat __________ times. Complete this exercise __________ times per day. STRENGTHENING EXERCISES - Impingement Syndrome (Rotator Cuff Tendinitis, Bursitis) These exercises may help you when beginning to rehabilitate your injury. They may resolve your symptoms with or without further involvement from your physician, physical therapist  or Event organiser. While completing these exercises, remember:  Muscles can gain both the endurance and the strength needed for everyday activities through controlled exercises.  Complete these exercises as instructed by your physician, physical therapist or athletic trainer. Increase the resistance and repetitions only as guided.  You may experience muscle soreness or fatigue, but the pain or discomfort you are trying to eliminate should never worsen during these exercises. If this pain does get worse, stop and make sure you are following the directions exactly. If the pain is still present after adjustments, discontinue the exercise until you can discuss the trouble with your clinician.  During your recovery, avoid activity or exercises which involve actions that place your injured hand or elbow above your head or behind your back or head.  These positions stress the tissues which you are trying to heal. STRENGTH - Scapular Depression and Adduction   With good posture, sit on a firm chair. Support your arms in front of you, with pillows, arm rests, or on a table top. Have your elbows in line with the sides of your body.  Gently draw your shoulder blades down and toward your mid-back spine. Gradually increase the tension, without tensing the muscles along the top of your shoulders and the back of your neck.  Hold for __________ seconds. Slowly release the tension and relax your muscles completely before starting the next repetition.  After you have practiced this exercise, remove the arm support and complete the exercise in standing as well as sitting position. Repeat __________ times. Complete this exercise __________ times per day.  STRENGTH - Shoulder Abductors, Isometric  With good posture, stand or sit about 4-6 inches from a wall, with your right / left side facing the wall.  Bend your right / left elbow. Gently press your right / left elbow into the wall. Increase the pressure gradually, until you are pressing as hard as you can, without shrugging your shoulder or increasing any shoulder discomfort.  Hold for __________ seconds.  Release the tension slowly. Relax your shoulder muscles completely before you begin the next repetition. Repeat __________ times. Complete this exercise __________ times per day.  STRENGTH - External Rotators, Isometric  Keep your right / left elbow at your side and bend it 90 degrees.  Step into a door frame so that the outside of your right / left wrist can press against the door frame without your upper arm leaving your side.  Gently press your right / left wrist into the door frame, as if you were trying to swing the back of your hand away from your stomach. Gradually increase the tension, until you are pressing as hard as you can, without shrugging your shoulder or increasing any shoulder  discomfort.  Hold for __________ seconds.  Release the tension slowly. Relax your shoulder muscles completely before you begin the next repetition. Repeat __________ times. Complete this exercise __________ times per day.  STRENGTH - Supraspinatus   Stand or sit with good posture. Grasp a __________ weight, or an exercise band or tubing, so that your hand is "thumbs-up," like you are shaking hands.  Slowly lift your right / left arm in a "V" away from your thigh, diagonally into the space between your side and straight ahead. Lift your hand to shoulder height or as far as you can, without increasing any shoulder pain. At first, many people do not lift their hands above shoulder height.  Avoid shrugging your right / left shoulder as your arm  rises, by keeping your shoulder blade tucked down and toward your mid-back spine.  Hold for __________ seconds. Control the descent of your hand, as you slowly return to your starting position. Repeat __________ times. Complete this exercise __________ times per day.  STRENGTH - External Rotators  Secure a rubber exercise band or tubing to a fixed object (table, pole) so that it is at the same height as your right / left elbow when you are standing or sitting on a firm surface.  Stand or sit so that the secured exercise band is at your uninjured side.  Bend your right / left elbow 90 degrees. Place a folded towel or small pillow under your right / left arm, so that your elbow is a few inches away from your side.  Keeping the tension on the exercise band, pull it away from your body, as if pivoting on your elbow. Be sure to keep your body steady, so that the movement is coming only from your rotating shoulder.  Hold for __________ seconds. Release the tension in a controlled manner, as you return to the starting position. Repeat __________ times. Complete this exercise __________ times per day.  STRENGTH - Internal Rotators   Secure a rubber exercise  band or tubing to a fixed object (table, pole) so that it is at the same height as your right / left elbow when you are standing or sitting on a firm surface.  Stand or sit so that the secured exercise band is at your right / left side.  Bend your elbow 90 degrees. Place a folded towel or small pillow under your right / left arm so that your elbow is a few inches away from your side.  Keeping the tension on the exercise band, pull it across your body, toward your stomach. Be sure to keep your body steady, so that the movement is coming only from your rotating shoulder.  Hold for __________ seconds. Release the tension in a controlled manner, as you return to the starting position. Repeat __________ times. Complete this exercise __________ times per day.  STRENGTH - Scapular Protractors, Standing   Stand arms length away from a wall. Place your hands on the wall, keeping your elbows straight.  Begin by dropping your shoulder blades down and toward your mid-back spine.  To strengthen your protractors, keep your shoulder blades down, but slide them forward on your rib cage. It will feel as if you are lifting the back of your rib cage away from the wall. This is a subtle motion and can be challenging to complete. Ask your caregiver for further instruction, if you are not sure you are doing the exercise correctly.  Hold for __________ seconds. Slowly return to the starting position, resting the muscles completely before starting the next repetition. Repeat __________ times. Complete this exercise __________ times per day. STRENGTH - Scapular Protractors, Supine  Lie on your back on a firm surface. Extend your right / left arm straight into the air while holding a __________ weight in your hand.  Keeping your head and back in place, lift your shoulder off the floor.  Hold for __________ seconds. Slowly return to the starting position, and allow your muscles to relax completely before starting the  next repetition. Repeat __________ times. Complete this exercise __________ times per day. STRENGTH - Scapular Protractors, Quadruped  Get onto your hands and knees, with your shoulders directly over your hands (or as close as you can be, comfortably).  Keeping your elbows  locked, lift the back of your rib cage up into your shoulder blades, so your mid-back rounds out. Keep your neck muscles relaxed.  Hold this position for __________ seconds. Slowly return to the starting position and allow your muscles to relax completely before starting the next repetition. Repeat __________ times. Complete this exercise __________ times per day.  STRENGTH - Scapular Retractors  Secure a rubber exercise band or tubing to a fixed object (table, pole), so that it is at the height of your shoulders when you are either standing, or sitting on a firm armless chair.  With a palm down grip, grasp an end of the band in each hand. Straighten your elbows and lift your hands straight in front of you, at shoulder height. Step back, away from the secured end of the band, until it becomes tense.  Squeezing your shoulder blades together, draw your elbows back toward your sides, as you bend them. Keep your upper arms lifted away from your body throughout the exercise.  Hold for __________ seconds. Slowly ease the tension on the band, as you reverse the directions and return to the starting position. Repeat __________ times. Complete this exercise __________ times per day. STRENGTH - Shoulder Extensors   Secure a rubber exercise band or tubing to a fixed object (table, pole) so that it is at the height of your shoulders when you are either standing, or sitting on a firm armless chair.  With a thumbs-up grip, grasp an end of the band in each hand. Straighten your elbows and lift your hands straight in front of you, at shoulder height. Step back, away from the secured end of the band, until it becomes tense.  Squeezing  your shoulder blades together, pull your hands down to the sides of your thighs. Do not allow your hands to go behind you.  Hold for __________ seconds. Slowly ease the tension on the band, as you reverse the directions and return to the starting position. Repeat __________ times. Complete this exercise __________ times per day.  STRENGTH - Scapular Retractors and External Rotators   Secure a rubber exercise band or tubing to a fixed object (table, pole) so that it is at the height as your shoulders, when you are either standing, or sitting on a firm armless chair.  With a palm down grip, grasp an end of the band in each hand. Bend your elbows 90 degrees and lift your elbows to shoulder height, at your sides. Step back, away from the secured end of the band, until it becomes tense.  Squeezing your shoulder blades together, rotate your shoulders so that your upper arms and elbows remain stationary, but your fists travel upward to head height.  Hold for __________ seconds. Slowly ease the tension on the band, as you reverse the directions and return to the starting position. Repeat __________ times. Complete this exercise __________ times per day.  STRENGTH - Scapular Retractors and External Rotators, Rowing   Secure a rubber exercise band or tubing to a fixed object (table, pole) so that it is at the height of your shoulders, when you are either standing, or sitting on a firm armless chair.  With a palm down grip, grasp an end of the band in each hand. Straighten your elbows and lift your hands straight in front of you, at shoulder height. Step back, away from the secured end of the band, until it becomes tense.  Step 1: Squeeze your shoulder blades together. Bending your elbows, draw your hands  to your chest, as if you are rowing a boat. At the end of this motion, your hands and elbow should be at shoulder height and your elbows should be out to your sides.  Step 2: Rotate your shoulders, to  raise your hands above your head. Your forearms should be vertical and your upper arms should be horizontal.  Hold for __________ seconds. Slowly ease the tension on the band, as you reverse the directions and return to the starting position. Repeat __________ times. Complete this exercise __________ times per day.  STRENGTH  Scapular Depressors  Find a sturdy chair without wheels, such as a dining room chair.  Keeping your feet on the floor, and your hands on the chair arms, lift your bottom up from the seat, and lock your elbows.  Keeping your elbows straight, allow gravity to pull your body weight down. Your shoulders will rise toward your ears.  Raise your body against gravity by drawing your shoulder blades down your back, shortening the distance between your shoulders and ears. Although your feet should always maintain contact with the floor, your feet should progressively support less body weight, as you get stronger.  Hold for __________ seconds. In a controlled and slow manner, lower your body weight to begin the next repetition. Repeat __________ times. Complete this exercise __________ times per day.  Document Released: 05/06/2005 Document Revised: 07/29/2011 Document Reviewed: 08/18/2008 [facility] Patient Information 2014 [place], [place].

## 2013-06-30 ENCOUNTER — Ambulatory Visit (INDEPENDENT_AMBULATORY_CARE_PROVIDER_SITE_OTHER): Payer: PRIVATE HEALTH INSURANCE | Admitting: Internal Medicine

## 2013-06-30 ENCOUNTER — Encounter: Payer: Self-pay | Admitting: Internal Medicine

## 2013-06-30 VITALS — BP 142/84 | HR 97 | Temp 99.2°F | Wt 198.4 lb

## 2013-06-30 DIAGNOSIS — G5600 Carpal tunnel syndrome, unspecified upper limb: Secondary | ICD-10-CM

## 2013-06-30 DIAGNOSIS — M25819 Other specified joint disorders, unspecified shoulder: Secondary | ICD-10-CM

## 2013-06-30 DIAGNOSIS — M7541 Impingement syndrome of right shoulder: Secondary | ICD-10-CM

## 2013-06-30 DIAGNOSIS — M7580 Other shoulder lesions, unspecified shoulder: Secondary | ICD-10-CM

## 2013-06-30 MED ORDER — DICLOFENAC SODIUM 75 MG PO TBEC: 75.0000 mg | DELAYED_RELEASE_TABLET | Freq: Two times a day (BID) | ORAL | Status: DC

## 2013-06-30 NOTE — Patient Instructions (Signed)
It was good to see you today.  We have reviewed your prior records including labs and tests today  Injection of your right shoulder for bursitis pain today - place ice over the injection site for 5 minutes every 4-6 hours for next 24h, then as needed. Do stretches as listed below and call if increasing pain or other problems  Stop meloxicam  Take diclofenac 2x/day every day for shoulder and wrist pain - Your prescription(s) have been submitted to your pharmacy. Please take as directed and contact our office if you believe you are having problem(s) with the medication(s).  continue night time wrist splint for support  If shoulder or hand symptoms unimproved after another 4 weeks, please call for referral to orthopedic specialist for further evaluation and treatment as needed  Impingement Syndrome, Rotator Cuff, Bursitis with Rehab Impingement syndrome is a condition that involves inflammation of the tendons of the rotator cuff and the subacromial bursa, that causes pain in the shoulder. The rotator cuff consists of four tendons and muscles that control much of the shoulder and upper arm function. The subacromial bursa is a fluid filled sac that helps reduce friction between the rotator cuff and one of the bones of the shoulder (acromion). Impingement syndrome is usually an overuse injury that causes swelling of the bursa (bursitis), swelling of the tendon (tendonitis), and/or a tear of the tendon (strain). Strains are classified into three categories. Grade 1 strains cause pain, but the tendon is not lengthened. Grade 2 strains include a lengthened ligament, due to the ligament being stretched or partially ruptured. With grade 2 strains there is still function, although the function may be decreased. Grade 3 strains include a complete tear of the tendon or muscle, and function is usually impaired. SYMPTOMS   Pain around the shoulder, often at the outer portion of the upper arm.  Pain that gets  worse with shoulder function, especially when reaching overhead or lifting.  Sometimes, aching when not using the arm.  Pain that wakes you up at night.  Sometimes, tenderness, swelling, warmth, or redness over the affected area.  Loss of strength.  Limited motion of the shoulder, especially reaching behind the back (to the back pocket or to unhook bra) or across your body.  Crackling sound (crepitation) when moving the arm.  Biceps tendon pain and inflammation (in the front of the shoulder). Worse when bending the elbow or lifting. CAUSES  Impingement syndrome is often an overuse injury, in which chronic (repetitive) motions cause the tendons or bursa to become inflamed. A strain occurs when a force is paced on the tendon or muscle that is greater than it can withstand. Common mechanisms of injury include: Stress from sudden increase in duration, frequency, or intensity of training.  Direct hit (trauma) to the shoulder.  Aging, erosion of the tendon with normal use.  Bony bump on shoulder (acromial spur). RISK INCREASES WITH:  Contact sports (football, wrestling, boxing).  Throwing sports (baseball, tennis, volleyball).  Weightlifting and bodybuilding.  Heavy labor.  Previous injury to the rotator cuff, including impingement.  Poor shoulder strength and flexibility.  Failure to warm up properly before activity.  Inadequate protective equipment.  Old age.  Bony bump on shoulder (acromial spur). PREVENTION   Warm up and stretch properly before activity.  Allow for adequate recovery between workouts.  Maintain physical fitness:  Strength, flexibility, and endurance.  Cardiovascular fitness.  Learn and use proper exercise technique. PROGNOSIS  If treated properly, impingement syndrome usually goes away  within 6 weeks. Sometimes surgery is required.  RELATED COMPLICATIONS   Longer healing time if not properly treated, or if not given enough time to  heal.  Recurring symptoms, that result in a chronic condition.  Shoulder stiffness, frozen shoulder, or loss of motion.  Rotator cuff tendon tear.  Recurring symptoms, especially if activity is resumed too soon, with overuse, with a direct blow, or when using poor technique. TREATMENT  Treatment first involves the use of ice and medicine, to reduce pain and inflammation. The use of strengthening and stretching exercises may help reduce pain with activity. These exercises may be performed at home or with a therapist. If non-surgical treatment is unsuccessful after more than 6 months, surgery may be advised. After surgery and rehabilitation, activity is usually possible in 3 months.  MEDICATION  If pain medicine is needed, nonsteroidal anti-inflammatory medicines (aspirin and ibuprofen), or other minor pain relievers (acetaminophen), are often advised.  Do not take pain medicine for 7 days before surgery.  Prescription pain relievers may be given, if your caregiver thinks they are needed. Use only as directed and only as much as you need.  Corticosteroid injections may be given by your caregiver. These injections should be reserved for the most serious cases, because they may only be given a certain number of times. HEAT AND COLD  Cold treatment (icing) should be applied for 10 to 15 minutes every 2 to 3 hours for inflammation and pain, and immediately after activity that aggravates your symptoms. Use ice packs or an ice massage.  Heat treatment may be used before performing stretching and strengthening activities prescribed by your caregiver, physical therapist, or athletic trainer. Use a heat pack or a warm water soak. SEEK MEDICAL CARE IF:   Symptoms get worse or do not improve in 4 to 6 weeks, despite treatment.  New, unexplained symptoms develop. (Drugs used in treatment may produce side effects.) EXERCISES  RANGE OF MOTION (ROM) AND STRETCHING EXERCISES - Impingement Syndrome  (Rotator Cuff  Tendinitis, Bursitis) These exercises may help you when beginning to rehabilitate your injury. Your symptoms may go away with or without further involvement from your physician, physical therapist or athletic trainer. While completing these exercises, remember:   Restoring tissue flexibility helps normal motion to return to the joints. This allows healthier, less painful movement and activity.  An effective stretch should be held for at least 30 seconds.  A stretch should never be painful. You should only feel a gentle lengthening or release in the stretched tissue. STRETCH  Flexion, Standing  Stand with good posture. With an underhand grip on your right / left hand, and an overhand grip on the opposite hand, grasp a broomstick or cane so that your hands are a little more than shoulder width apart.  Keeping your right / left elbow straight and shoulder muscles relaxed, push the stick with your opposite hand, to raise your right / left arm in front of your body and then overhead. Raise your arm until you feel a stretch in your right / left shoulder, but before you have increased shoulder pain.  Try to avoid shrugging your right / left shoulder as your arm rises, by keeping your shoulder blade tucked down and toward your mid-back spine. Hold for __________ seconds.  Slowly return to the starting position. Repeat __________ times. Complete this exercise __________ times per day. STRETCH  Abduction, Supine  Lie on your back. With an underhand grip on your right / left hand and an  overhand grip on the opposite hand, grasp a broomstick or cane so that your hands are a little more than shoulder width apart.  Keeping your right / left elbow straight and your shoulder muscles relaxed, push the stick with your opposite hand, to raise your right / left arm out to the side of your body and then overhead. Raise your arm until you feel a stretch in your right / left shoulder, but before you  have increased shoulder pain.  Try to avoid shrugging your right / left shoulder as your arm rises, by keeping your shoulder blade tucked down and toward your mid-back spine. Hold for __________ seconds.  Slowly return to the starting position. Repeat __________ times. Complete this exercise __________ times per day. ROM  Flexion, Active-Assisted  Lie on your back. You may bend your knees for comfort.  Grasp a broomstick or cane so your hands are about shoulder width apart. Your right / left hand should grip the end of the stick, so that your hand is positioned "thumbs-up," as if you were about to shake hands.  Using your healthy arm to lead, raise your right / left arm overhead, until you feel a gentle stretch in your shoulder. Hold for __________ seconds.  Use the stick to assist in returning your right / left arm to its starting position. Repeat __________ times. Complete this exercise __________ times per day.  ROM - Internal Rotation, Supine   Lie on your back on a firm surface. Place your right / left elbow about 60 degrees away from your side. Elevate your elbow with a folded towel, so that the elbow and shoulder are the same height.  Using a broomstick or cane and your strong arm, pull your right / left hand toward your body until you feel a gentle stretch, but no increase in your shoulder pain. Keep your shoulder and elbow in place throughout the exercise.  Hold for __________ seconds. Slowly return to the starting position. Repeat __________ times. Complete this exercise __________ times per day. STRETCH - Internal Rotation  Place your right / left hand behind your back, palm up.  Throw a towel or belt over your opposite shoulder. Grasp the towel with your right / left hand.  While keeping an upright posture, gently pull up on the towel, until you feel a stretch in the front of your right / left shoulder.  Avoid shrugging your right / left shoulder as your arm rises, by  keeping your shoulder blade tucked down and toward your mid-back spine.  Hold for __________ seconds. Release the stretch, by lowering your healthy hand. Repeat __________ times. Complete this exercise __________ times per day. ROM - Internal Rotation   Using an underhand grip, grasp a stick behind your back with both hands.  While standing upright with good posture, slide the stick up your back until you feel a mild stretch in the front of your shoulder.  Hold for __________ seconds. Slowly return to your starting position. Repeat __________ times. Complete this exercise __________ times per day.  STRETCH  Posterior Shoulder Capsule   Stand or sit with good posture. Grasp your right / left elbow and draw it across your chest, keeping it at the same height as your shoulder.  Pull your elbow, so your upper arm comes in closer to your chest. Pull until you feel a gentle stretch in the back of your shoulder.  Hold for __________ seconds. Repeat __________ times. Complete this exercise __________ times per day.  STRENGTHENING EXERCISES - Impingement Syndrome (Rotator Cuff Tendinitis, Bursitis) These exercises may help you when beginning to rehabilitate your injury. They may resolve your symptoms with or without further involvement from your physician, physical therapist or athletic trainer. While completing these exercises, remember:  Muscles can gain both the endurance and the strength needed for everyday activities through controlled exercises.  Complete these exercises as instructed by your physician, physical therapist or athletic trainer. Increase the resistance and repetitions only as guided.  You may experience muscle soreness or fatigue, but the pain or discomfort you are trying to eliminate should never worsen during these exercises. If this pain does get worse, stop and make sure you are following the directions exactly. If the pain is still present after adjustments, discontinue the  exercise until you can discuss the trouble with your clinician.  During your recovery, avoid activity or exercises which involve actions that place your injured hand or elbow above your head or behind your back or head. These positions stress the tissues which you are trying to heal. STRENGTH - Scapular Depression and Adduction   With good posture, sit on a firm chair. Support your arms in front of you, with pillows, arm rests, or on a table top. Have your elbows in line with the sides of your body.  Gently draw your shoulder blades down and toward your mid-back spine. Gradually increase the tension, without tensing the muscles along the top of your shoulders and the back of your neck.  Hold for __________ seconds. Slowly release the tension and relax your muscles completely before starting the next repetition.  After you have practiced this exercise, remove the arm support and complete the exercise in standing as well as sitting position. Repeat __________ times. Complete this exercise __________ times per day.  STRENGTH - Shoulder Abductors, Isometric  With good posture, stand or sit about 4-6 inches from a wall, with your right / left side facing the wall.  Bend your right / left elbow. Gently press your right / left elbow into the wall. Increase the pressure gradually, until you are pressing as hard as you can, without shrugging your shoulder or increasing any shoulder discomfort.  Hold for __________ seconds.  Release the tension slowly. Relax your shoulder muscles completely before you begin the next repetition. Repeat __________ times. Complete this exercise __________ times per day.  STRENGTH - External Rotators, Isometric  Keep your right / left elbow at your side and bend it 90 degrees.  Step into a door frame so that the outside of your right / left wrist can press against the door frame without your upper arm leaving your side.  Gently press your right / left wrist into the  door frame, as if you were trying to swing the back of your hand away from your stomach. Gradually increase the tension, until you are pressing as hard as you can, without shrugging your shoulder or increasing any shoulder discomfort.  Hold for __________ seconds.  Release the tension slowly. Relax your shoulder muscles completely before you begin the next repetition. Repeat __________ times. Complete this exercise __________ times per day.  STRENGTH - Supraspinatus   Stand or sit with good posture. Grasp a __________ weight, or an exercise band or tubing, so that your hand is "thumbs-up," like you are shaking hands.  Slowly lift your right / left arm in a "V" away from your thigh, diagonally into the space between your side and straight ahead. Lift your hand to shoulder  height or as far as you can, without increasing any shoulder pain. At first, many people do not lift their hands above shoulder height.  Avoid shrugging your right / left shoulder as your arm rises, by keeping your shoulder blade tucked down and toward your mid-back spine.  Hold for __________ seconds. Control the descent of your hand, as you slowly return to your starting position. Repeat __________ times. Complete this exercise __________ times per day.  STRENGTH - External Rotators  Secure a rubber exercise band or tubing to a fixed object (table, pole) so that it is at the same height as your right / left elbow when you are standing or sitting on a firm surface.  Stand or sit so that the secured exercise band is at your uninjured side.  Bend your right / left elbow 90 degrees. Place a folded towel or small pillow under your right / left arm, so that your elbow is a few inches away from your side.  Keeping the tension on the exercise band, pull it away from your body, as if pivoting on your elbow. Be sure to keep your body steady, so that the movement is coming only from your rotating shoulder.  Hold for __________  seconds. Release the tension in a controlled manner, as you return to the starting position. Repeat __________ times. Complete this exercise __________ times per day.  STRENGTH - Internal Rotators   Secure a rubber exercise band or tubing to a fixed object (table, pole) so that it is at the same height as your right / left elbow when you are standing or sitting on a firm surface.  Stand or sit so that the secured exercise band is at your right / left side.  Bend your elbow 90 degrees. Place a folded towel or small pillow under your right / left arm so that your elbow is a few inches away from your side.  Keeping the tension on the exercise band, pull it across your body, toward your stomach. Be sure to keep your body steady, so that the movement is coming only from your rotating shoulder.  Hold for __________ seconds. Release the tension in a controlled manner, as you return to the starting position. Repeat __________ times. Complete this exercise __________ times per day.  STRENGTH - Scapular Protractors, Standing   Stand arms length away from a wall. Place your hands on the wall, keeping your elbows straight.  Begin by dropping your shoulder blades down and toward your mid-back spine.  To strengthen your protractors, keep your shoulder blades down, but slide them forward on your rib cage. It will feel as if you are lifting the back of your rib cage away from the wall. This is a subtle motion and can be challenging to complete. Ask your caregiver for further instruction, if you are not sure you are doing the exercise correctly.  Hold for __________ seconds. Slowly return to the starting position, resting the muscles completely before starting the next repetition. Repeat __________ times. Complete this exercise __________ times per day. STRENGTH - Scapular Protractors, Supine  Lie on your back on a firm surface. Extend your right / left arm straight into the air while holding a __________  weight in your hand.  Keeping your head and back in place, lift your shoulder off the floor.  Hold for __________ seconds. Slowly return to the starting position, and allow your muscles to relax completely before starting the next repetition. Repeat __________ times. Complete this exercise  __________ times per day. STRENGTH - Scapular Protractors, Quadruped  Get onto your hands and knees, with your shoulders directly over your hands (or as close as you can be, comfortably).  Keeping your elbows locked, lift the back of your rib cage up into your shoulder blades, so your mid-back rounds out. Keep your neck muscles relaxed.  Hold this position for __________ seconds. Slowly return to the starting position and allow your muscles to relax completely before starting the next repetition. Repeat __________ times. Complete this exercise __________ times per day.  STRENGTH - Scapular Retractors  Secure a rubber exercise band or tubing to a fixed object (table, pole), so that it is at the height of your shoulders when you are either standing, or sitting on a firm armless chair.  With a palm down grip, grasp an end of the band in each hand. Straighten your elbows and lift your hands straight in front of you, at shoulder height. Step back, away from the secured end of the band, until it becomes tense.  Squeezing your shoulder blades together, draw your elbows back toward your sides, as you bend them. Keep your upper arms lifted away from your body throughout the exercise.  Hold for __________ seconds. Slowly ease the tension on the band, as you reverse the directions and return to the starting position. Repeat __________ times. Complete this exercise __________ times per day. STRENGTH - Shoulder Extensors   Secure a rubber exercise band or tubing to a fixed object (table, pole) so that it is at the height of your shoulders when you are either standing, or sitting on a firm armless chair.  With a  thumbs-up grip, grasp an end of the band in each hand. Straighten your elbows and lift your hands straight in front of you, at shoulder height. Step back, away from the secured end of the band, until it becomes tense.  Squeezing your shoulder blades together, pull your hands down to the sides of your thighs. Do not allow your hands to go behind you.  Hold for __________ seconds. Slowly ease the tension on the band, as you reverse the directions and return to the starting position. Repeat __________ times. Complete this exercise __________ times per day.  STRENGTH - Scapular Retractors and External Rotators   Secure a rubber exercise band or tubing to a fixed object (table, pole) so that it is at the height as your shoulders, when you are either standing, or sitting on a firm armless chair.  With a palm down grip, grasp an end of the band in each hand. Bend your elbows 90 degrees and lift your elbows to shoulder height, at your sides. Step back, away from the secured end of the band, until it becomes tense.  Squeezing your shoulder blades together, rotate your shoulders so that your upper arms and elbows remain stationary, but your fists travel upward to head height.  Hold for __________ seconds. Slowly ease the tension on the band, as you reverse the directions and return to the starting position. Repeat __________ times. Complete this exercise __________ times per day.  STRENGTH - Scapular Retractors and External Rotators, Rowing   Secure a rubber exercise band or tubing to a fixed object (table, pole) so that it is at the height of your shoulders, when you are either standing, or sitting on a firm armless chair.  With a palm down grip, grasp an end of the band in each hand. Straighten your elbows and lift your hands straight  in front of you, at shoulder height. Step back, away from the secured end of the band, until it becomes tense.  Step 1: Squeeze your shoulder blades together. Bending  your elbows, draw your hands to your chest, as if you are rowing a boat. At the end of this motion, your hands and elbow should be at shoulder height and your elbows should be out to your sides.  Step 2: Rotate your shoulders, to raise your hands above your head. Your forearms should be vertical and your upper arms should be horizontal.  Hold for __________ seconds. Slowly ease the tension on the band, as you reverse the directions and return to the starting position. Repeat __________ times. Complete this exercise __________ times per day.  STRENGTH  Scapular Depressors  Find a sturdy chair without wheels, such as a dining room chair.  Keeping your feet on the floor, and your hands on the chair arms, lift your bottom up from the seat, and lock your elbows.  Keeping your elbows straight, allow gravity to pull your body weight down. Your shoulders will rise toward your ears.  Raise your body against gravity by drawing your shoulder blades down your back, shortening the distance between your shoulders and ears. Although your feet should always maintain contact with the floor, your feet should progressively support less body weight, as you get stronger.  Hold for __________ seconds. In a controlled and slow manner, lower your body weight to begin the next repetition. Repeat __________ times. Complete this exercise __________ times per day.  Document Released: 05/06/2005 Document Revised: 07/29/2011 Document Reviewed: 08/18/2008 [facility] Patient Information 2014 [place], [place].

## 2013-06-30 NOTE — Assessment & Plan Note (Signed)
Steroid injection by sports medicine 06/04/2013 Continues with nocturnal wrist splint Pain and strength improving, and numbness persist Reassurance provided, patient will continue ongoing care and call if symptoms worse or unimproved for referral to orthopedic specialist as needed to consider release

## 2013-06-30 NOTE — Assessment & Plan Note (Signed)
Continued symptoms despite conservative care with po NSAIDs and home PT Steroid injection today - see above Change meloxicam to alt NSAID - diclofenac - erx done If continued problems, patient will call for referral to orthopedic specialist as needed

## 2013-06-30 NOTE — Progress Notes (Signed)
ELDAR ROBITAILLE [id number] 06/30/2013   Chief Complaint  Patient presents with  . Follow-up    Subjective  HPI  Past Medical History  Diagnosis Date  . ERECTILE DYSFUNCTION, ORGANIC   . ELEVATED BLOOD PRESSURE   . FOLLICULITIS     Past Surgical History  Procedure Laterality Date  . No past surgeries      No family history on file.  History  Substance Use Topics  . Smoking status: Current Every Day Smoker -- 1.00 packs/day    Types: Cigars  . Smokeless tobacco: Not on file     Comment: separated from wife since 2003. lives with mom & step dad  . Alcohol Use: 16.8 oz/week    14 Cans of beer, 14 Shots of liquor per week     Comment: Social    Current Outpatient Prescriptions on File Prior to Visit  Medication Sig Dispense Refill  . gabapentin (NEURONTIN) 300 MG capsule One pill at night.  30 capsule  3  . HYDROcodone-acetaminophen (NORCO) 5-325 MG per tablet Take 1 tablet by mouth at bedtime as needed.  20 tablet  0  . meloxicam (MOBIC) 15 MG tablet Take 1 tablet (15 mg total) by mouth daily.  30 tablet  0  . Multiple Vitamin (MULTIVITAMIN) tablet Take 1 tablet by mouth daily.        . traMADol (ULTRAM) 50 MG tablet Take 1 tablet (50 mg total) by mouth at bedtime as needed.  30 tablet  0   No current facility-administered medications on file prior to visit.    Allergies: No Known Allergies  ROS     Objective  Filed Vitals:   06/30/13 0859  BP: 142/84  Pulse: 97  Temp: 99.2 F (37.3 C)  TempSrc: Oral  Weight: 198 lb 6.4 oz (89.994 kg)  SpO2: 95%    Physical Exam  BP Readings from Last 3 Encounters:  06/30/13 142/84  06/16/13 128/80  06/04/13 112/70    Wt Readings from Last 3 Encounters:  06/30/13 198 lb 6.4 oz (89.994 kg)  06/04/13 203 lb (92.08 kg)  06/04/13 203 lb (92.08 kg)    Lab Results  Component Value Date   WBC 5.2 02/06/2010   HGB 13.2 02/06/2010   HCT 38.1* 02/06/2010   PLT 226.0 02/06/2010   GLUCOSE 77 02/06/2010   CHOL 126  02/06/2010   TRIG 43.0 02/06/2010   HDL 47.80 02/06/2010   LDLCALC 70 02/06/2010   ALT 13 02/06/2010   AST 21 02/06/2010   NA 139 02/06/2010   K 3.9 02/06/2010   CL 104 02/06/2010   CREATININE 1.1 02/06/2010   BUN 13 02/06/2010   CO2 26 02/06/2010   TSH 1.72 02/06/2010   PSA 0.35 02/06/2010   HGBA1C 5.3 05/30/2013    Dg Shoulder Right  06/16/2013   CLINICAL DATA:  Pain, status post injury  EXAM: RIGHT SHOULDER - 2+ VIEW  COMPARISON:  None.  FINDINGS: There is no evidence of fracture or dislocation. There is no evidence of arthropathy or other focal bone abnormality. Soft tissues are unremarkable.  IMPRESSION: Negative.   Electronically Signed   By: Salome Holmes M.D.   On: 06/16/2013 12:26     Assessment and Plan  R>L  Carpal tunnel -   Patient reports mild improvement from initial symptons. Steroid injection done my Dr. Katrinka Blazing on 06/04/2013. Continues to take meloxicam qd for pain and inflammation.  Noticeable strength increase since last visit.  Strength still not equal to  left hand.  Numbness still present on today's exam.  Patient wishes to avoid surgery.  Informed patient on the process of carpal tunnel.  Also discussed surgery.   Switched NSAID to Diclofenac.   If symptoms do not improve will consider referring to a hand specialist for possible surgical evaluation and treatment.   Shoulder impingement R - X-ray revealed no bony abnormalities.  Impingement still present on today's exam.  No improvement with meloxicam.  Steroid inject done today to help reduce inflammation.  If symptoms do not improve,  Will consider referring to orthopedic for further evaluation and treatment.   Procedure note (performed by Dr Milton FergusonVAL)  - shoulder injection, subacromial - right  Indication - impingement syndrome  After obtaining verbal informed consent, using sterile technique throughout, shoulder was injected posterior lateral into the subacromial space with 1:3 cc Depo-Medrol 80mg /ml: 1% lidocaine without epi.  Patient tolerated procedure well, no complications. Postinjection instructions given: Ice 24-48 hours at injection site, heat thereafter, maintaining motion. Patient to call if any concerns.   Patient was instructed to notify the office if any new symptoms emerged or the current symptoms become worse.   No Follow-up on file. Jonna CoupBenda, Guy Alan, Student-PA    I have personally reviewed this case with PA student. I also personally examined this patient. I agree with history and findings as documented above. I reviewed, discussed and approve of the assessment and plan as listed above. Rene PaciValerie Leschber, MD

## 2013-06-30 NOTE — Progress Notes (Signed)
Subjective:    Patient ID: Kyle Morse, male    DOB: 08/03/1960, 53 y.o.   MRN: [id number]  HPI  Here for follow up R hand and R shoulder pain Interval hx reviewed Pain worst at night in shoulder Pain and weakness improved in R hand since injection 1/16 but persisting numb sensation  Past Medical History  Diagnosis Date  . ERECTILE DYSFUNCTION, ORGANIC   . ELEVATED BLOOD PRESSURE   . FOLLICULITIS     Review of Systems  Constitutional: Negative for fever and fatigue.  Musculoskeletal: Positive for arthralgias (r shoulder x 4 weeks). Negative for joint swelling and myalgias.  Neurological: Positive for numbness (r hand). Negative for tremors, facial asymmetry, weakness (right hand) and headaches.       Objective:   Physical Exam BP 142/84  Pulse 97  Temp(Src) 99.2 F (37.3 C) (Oral)  Wt 198 lb 6.4 oz (89.994 kg)  SpO2 95% Wt Readings from Last 3 Encounters:  06/30/13 198 lb 6.4 oz (89.994 kg)  06/04/13 203 lb (92.08 kg)  06/04/13 203 lb (92.08 kg)   Gen: uncomfortable but no acute distress MSkel: improved soft tissue swelling of right hand namely in thumb, index and third finger. No synovitis over PIP/DIP MCP or wrist. Full range of motion with decreased strength of hand grip right compared to left. No atrophy or other gross deformity R Shoulder: Full range of motion. Neurovascularly intact distally. Good strength with stress of rotator cuff but causes pain. Positive impingement signs. Neuro - mildly decreased grip strength hand right compared to left.  Lab Results  Component Value Date   WBC 5.2 02/06/2010   HGB 13.2 02/06/2010   HCT 38.1* 02/06/2010   PLT 226.0 02/06/2010   GLUCOSE 77 02/06/2010   CHOL 126 02/06/2010   TRIG 43.0 02/06/2010   HDL 47.80 02/06/2010   LDLCALC 70 02/06/2010   ALT 13 02/06/2010   AST 21 02/06/2010   NA 139 02/06/2010   K 3.9 02/06/2010   CL 104 02/06/2010   CREATININE 1.1 02/06/2010   BUN 13 02/06/2010   CO2 26 02/06/2010   TSH 1.72  02/06/2010   PSA 0.35 02/06/2010   HGBA1C 5.3 05/30/2013   Procedure note - shoulder injection, subacromial - right Indication - impingement syndrome After obtaining verbal informed consent, using sterile technique throughout, shoulder was injected posterior lateral into the subacromial space with 1:3 cc Depo-Medrol 80mg /ml: 1% lidocaine without epi. Patient tolerated procedure well, no complications. Postinjection instructions given: Ice 24-48 hours at injection site, heat thereafter, maintaining motion. Patient to call if any concerns.     Assessment & Plan:   Problem List Items Addressed This Visit   Carpal tunnel syndrome     Steroid injection by sports medicine 06/04/2013 Continues with nocturnal wrist splint Pain and strength improving, and numbness persist Reassurance provided, patient will continue ongoing care and call if symptoms worse or unimproved for referral to orthopedic specialist as needed to consider release    Impingement syndrome of right shoulder - Primary     Continued symptoms despite conservative care with po NSAIDs and home PT Steroid injection today - see above Change meloxicam to alt NSAID - diclofenac - erx done If continued problems, patient will call for referral to orthopedic specialist as needed       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 Patient will consider and call if work note needed

## 2013-06-30 NOTE — Progress Notes (Signed)
Pre-visit discussion using our clinic review tool. No additional management support is needed unless otherwise documented below in the visit note.  

## 2013-07-30 ENCOUNTER — Encounter: Payer: Self-pay | Admitting: Family Medicine

## 2013-07-30 ENCOUNTER — Other Ambulatory Visit (INDEPENDENT_AMBULATORY_CARE_PROVIDER_SITE_OTHER): Payer: PRIVATE HEALTH INSURANCE

## 2013-07-30 ENCOUNTER — Ambulatory Visit (INDEPENDENT_AMBULATORY_CARE_PROVIDER_SITE_OTHER): Payer: PRIVATE HEALTH INSURANCE | Admitting: Family Medicine

## 2013-07-30 VITALS — BP 130/72 | HR 97 | Temp 99.1°F | Resp 16 | Wt 194.0 lb

## 2013-07-30 DIAGNOSIS — G5600 Carpal tunnel syndrome, unspecified upper limb: Secondary | ICD-10-CM

## 2013-07-30 NOTE — Progress Notes (Signed)
  CC: Right wrist pain follow up  HPI: Patient is following up for carpal tunnel syndrome.  Was given an injection about 6 weeks ago. Patient states he is approximately 65% better. Patient continues to wear the brace at night and has been taking anti-inflammatories with ibuprofen. Patient states that this does seem to help the pain. Patient still hasn't for some constant numbness but states that the strength seems to be better. Patient denies any new symptoms and states that the swelling is better as well.   Past medical, surgical, family and social history reviewed. Medications reviewed all in the electronic medical record.   Review of Systems: No headache, visual changes, nausea, vomiting, diarrhea, constipation, dizziness, abdominal pain, skin rash, fevers, chills, night sweats, weight loss, swollen lymph nodes, body aches, joint swelling, muscle aches, chest pain, shortness of breath, mood changes.   Objective:    Blood pressure 130/72, pulse 97, temperature 99.1 F (37.3 C), temperature source Oral, resp. rate 16, weight 194 lb (87.998 kg), SpO2 98.00%.   General: No apparent distress alert and oriented x3 mood and affect normal, dressed appropriately.  HEENT: Pupils equal, extraocular movements intact Respiratory: Patient's speak in full sentences and does not appear short of breath Cardiovascular: No lower extremity edema, non tender, no erythema Skin: Warm dry intact with no signs of infection or rash on extremities or on axial skeleton. Abdomen: Soft nontender Neuro: Cranial nerves II through XII are intact, neurovascularly intact in all extremities with 2+ DTRs and 2+ pulses. Lymph: No lymphadenopathy of posterior or anterior cervical chain or axillae bilaterally.  Gait normal with good balance and coordination.  MSK: Non tender with full range of motion and good stability and symmetric strength and tone of shoulders, elbows, hip, knee and ankles bilaterally.  Wrist:  Right Inspection shows the patient does have some swelling of this right hand compared to the contralateral side but improved from last visit.  ROM smooth and normal with good flexion and extension and ulnar/radial deviation that is symmetrical with opposite wrist. Palpation is normal over metacarpals, navicular, lunate, and TFCC; tendons without tenderness/ swelling No snuffbox tenderness. No tenderness over Canal of Guyon. Strength 5/5 in all directions without pain. Negative Finkelstein,  POSITIVE tinel's and phalens. Negative Watson's test.  MSK US performed of: Right wrist This study was ordered, performed, and interpreted by Terrilee FilesZach Keyundra Fant D.O.  Wrist: All extensor compartments visualized and tendons all normal in appearance without fraying, tears, or sheath effusions. No effusion seen. TFCC intact. Scapholunate ligament intact. Carpal tunnel visualized but does have significant scarring of the nerve sheath surrounding the area and does have an area of 1.7cm down from 2.09cm from last time.   Power doppler signal normal.  IMPRESSION:  Carpal tunnel syndrome but improved      Impression and Recommendations:     This case required medical decision making of moderate complexity.

## 2013-07-30 NOTE — Progress Notes (Signed)
Pre visit review using our clinic review tool, if applicable. No additional management support is needed unless otherwise documented below in the visit note. 

## 2013-07-30 NOTE — Patient Instructions (Signed)
Mr. Mariann LasterBowden, it was nice seeing you today. Please take only take your advil and stop taking the voltaren. Start taking Vitamin D 2000 IU per day You may start taking Tumeric 500 mg, two times per day to help with inflammation I will see you back in 2 weeks and we can attempt an injection into your carpal tunnel if you are still having symptoms.   We may check your thyroid at next visit.  If this does not work, I will see you back in 4 weeks and you may need to go to a hand surgeon for a carpal tunnel release.

## 2013-07-30 NOTE — Assessment & Plan Note (Signed)
Is improving slowly but still concern with the chronic numbness that patient has.  Excited at the increase in strength.  Patient will continue other regimen, stop naproxen for now and only do ibuprofen.   Discussed surgery again as a possibility which he declined.  Discussed OTC at this time.  RTC in 2 weeks and if still inpain will try one more injection but will likely need release.

## 2013-08-09 ENCOUNTER — Other Ambulatory Visit (INDEPENDENT_AMBULATORY_CARE_PROVIDER_SITE_OTHER): Payer: PRIVATE HEALTH INSURANCE

## 2013-08-09 ENCOUNTER — Encounter: Payer: Self-pay | Admitting: Family Medicine

## 2013-08-09 ENCOUNTER — Ambulatory Visit (INDEPENDENT_AMBULATORY_CARE_PROVIDER_SITE_OTHER): Payer: PRIVATE HEALTH INSURANCE | Admitting: Family Medicine

## 2013-08-09 VITALS — BP 150/88 | HR 84

## 2013-08-09 DIAGNOSIS — M79642 Pain in left hand: Secondary | ICD-10-CM

## 2013-08-09 DIAGNOSIS — R5381 Other malaise: Secondary | ICD-10-CM

## 2013-08-09 DIAGNOSIS — R5383 Other fatigue: Secondary | ICD-10-CM

## 2013-08-09 DIAGNOSIS — M79609 Pain in unspecified limb: Secondary | ICD-10-CM

## 2013-08-09 DIAGNOSIS — M7989 Other specified soft tissue disorders: Secondary | ICD-10-CM

## 2013-08-09 DIAGNOSIS — G5600 Carpal tunnel syndrome, unspecified upper limb: Secondary | ICD-10-CM | POA: Diagnosis not present

## 2013-08-09 LAB — COMPREHENSIVE METABOLIC PANEL
ALBUMIN: 3.2 g/dL — AB (ref 3.5–5.2)
ALK PHOS: 50 U/L (ref 39–117)
ALT: 17 U/L (ref 0–53)
AST: 15 U/L (ref 0–37)
BUN: 10 mg/dL (ref 6–23)
CALCIUM: 9.3 mg/dL (ref 8.4–10.5)
CO2: 27 mEq/L (ref 19–32)
Chloride: 99 mEq/L (ref 96–112)
Creatinine, Ser: 0.9 mg/dL (ref 0.4–1.5)
GFR: 95.5 mL/min (ref >60.00)
Glucose, Bld: 82 mg/dL (ref 70–99)
POTASSIUM: 4 meq/L (ref 3.5–5.1)
Sodium: 135 mEq/L (ref 135–145)
Total Bilirubin: 0.5 mg/dL (ref 0.3–1.2)
Total Protein: 7.9 g/dL (ref 6.0–8.3)

## 2013-08-09 LAB — TSH: TSH: 1 u[IU]/mL (ref 0.35–5.50)

## 2013-08-09 LAB — LIPID PANEL
CHOL/HDL RATIO: 3
Cholesterol: 107 mg/dL (ref 0–200)
HDL: 38.2 mg/dL — AB (ref >39.00)
LDL Cholesterol: 61 mg/dL (ref 0–99)
TRIGLYCERIDES: 41 mg/dL (ref 0.0–149.0)
VLDL: 8.2 mg/dL (ref 0.0–40.0)

## 2013-08-09 LAB — T4, FREE: FREE T4: 0.96 ng/dL (ref 0.60–1.60)

## 2013-08-09 LAB — URIC ACID: Uric Acid, Serum: 4.2 mg/dL (ref 4.0–7.8)

## 2013-08-09 LAB — T3, FREE: T3 FREE: 2.3 pg/mL (ref 2.3–4.2)

## 2013-08-09 LAB — SEDIMENTATION RATE: Sed Rate: 99 mm/hr — ABNORMAL HIGH (ref 0–22)

## 2013-08-09 MED ORDER — PREDNISONE 50 MG PO TABS: ORAL_TABLET | ORAL | Status: DC

## 2013-08-09 MED ORDER — KETOROLAC TROMETHAMINE 60 MG/2ML IM SOLN
60.0000 mg | Freq: Once | INTRAMUSCULAR | Status: AC
  Administered 2013-08-09: 60 mg via INTRAMUSCULAR

## 2013-08-09 NOTE — Patient Instructions (Addendum)
Good to see you Get labs downstairs today Try prednisone daily for next 5 days Gave you an injection in your right hand that should help the carpal tunnel. Come back in 1 week.   Gout Gout is an inflammatory arthritis caused by a buildup of uric acid crystals in the joints. Uric acid is a chemical that is normally present in the blood. When the level of uric acid in the blood is too high it can form crystals that deposit in your joints and tissues. This causes joint redness, soreness, and swelling (inflammation). Repeat attacks are common. Over time, uric acid crystals can form into masses (tophi) near a joint, destroying bone and causing disfigurement. Gout is treatable and often preventable. CAUSES  The disease begins with elevated levels of uric acid in the blood. Uric acid is produced by your body when it breaks down a naturally found substance called purines. Certain foods you eat, such as meats and fish, contain high amounts of purines. Causes of an elevated uric acid level include:  Being passed down from parent to child (heredity).  Diseases that cause increased uric acid production (such as obesity, psoriasis, and certain cancers).  Excessive alcohol use.  Diet, especially diets rich in meat and seafood.  Medicines, including certain cancer-fighting medicines (chemotherapy), water pills (diuretics), and aspirin.  Chronic kidney disease. The kidneys are no longer able to remove uric acid well.  Problems with metabolism. Conditions strongly associated with gout include:  Obesity.  High blood pressure.  High cholesterol.  Diabetes. Not everyone with elevated uric acid levels gets gout. It is not understood why some people get gout and others do not. Surgery, joint injury, and eating too much of certain foods are some of the factors that can lead to gout attacks. SYMPTOMS   An attack of gout comes on quickly. It causes intense pain with redness, swelling, and warmth in a  joint.  Fever can occur.  Often, only one joint is involved. Certain joints are more commonly involved:  Base of the big toe.  Knee.  Ankle.  Wrist.  Finger. Without treatment, an attack usually goes away in a few days to weeks. Between attacks, you usually will not have symptoms, which is different from many other forms of arthritis. DIAGNOSIS  Your caregiver will suspect gout based on your symptoms and exam. In some cases, tests may be recommended. The tests may include:  Blood tests.  Urine tests.  X-rays.  Joint fluid exam. This exam requires a needle to remove fluid from the joint (arthrocentesis). Using a microscope, gout is confirmed when uric acid crystals are seen in the joint fluid. TREATMENT  There are two phases to gout treatment: treating the sudden onset (acute) attack and preventing attacks (prophylaxis).  Treatment of an Acute Attack.  Medicines are used. These include anti-inflammatory medicines or steroid medicines.  An injection of steroid medicine into the affected joint is sometimes necessary.  The painful joint is rested. Movement can worsen the arthritis.  You may use warm or cold treatments on painful joints, depending which works best for you.  Treatment to Prevent Attacks.  If you suffer from frequent gout attacks, your caregiver may advise preventive medicine. These medicines are started after the acute attack subsides. These medicines either help your kidneys eliminate uric acid from your body or decrease your uric acid production. You may need to stay on these medicines for a very long time.  The early phase of treatment with preventive medicine can be  associated with an increase in acute gout attacks. For this reason, during the first few months of treatment, your caregiver may also advise you to take medicines usually used for acute gout treatment. Be sure you understand your caregiver's directions. Your caregiver may make several adjustments  to your medicine dose before these medicines are effective.  Discuss dietary treatment with your caregiver or dietitian. Alcohol and drinks high in sugar and fructose and foods such as meat, poultry, and seafood can increase uric acid levels. Your caregiver or dietician can advise you on drinks and foods that should be limited. HOME CARE INSTRUCTIONS   Do not take aspirin to relieve pain. This raises uric acid levels.  Only take over-the-counter or prescription medicines for pain, discomfort, or fever as directed by your caregiver.  Rest the joint as much as possible. When in bed, keep sheets and blankets off painful areas.  Keep the affected joint raised (elevated).  Apply warm or cold treatments to painful joints. Use of warm or cold treatments depends on which works best for you.  Use crutches if the painful joint is in your leg.  Drink enough fluids to keep your urine clear or pale yellow. This helps your body get rid of uric acid. Limit alcohol, sugary drinks, and fructose drinks.  Follow your dietary instructions. Pay careful attention to the amount of protein you eat. Your daily diet should emphasize fruits, vegetables, whole grains, and fat-free or low-fat milk products. Discuss the use of coffee, vitamin C, and cherries with your caregiver or dietician. These may be helpful in lowering uric acid levels.  Maintain a healthy body weight. SEEK MEDICAL CARE IF:   You develop diarrhea, vomiting, or any side effects from medicines.  You do not feel better in 24 hours, or you are getting worse. SEEK IMMEDIATE MEDICAL CARE IF:   Your joint becomes suddenly more tender, and you have chills or a fever. MAKE SURE YOU:   Understand these instructions.  Will watch your condition.  Will get help right away if you are not doing well or get worse. Document Released: 05/03/2000 Document Revised: 08/31/2012 Document Reviewed: 12/18/2011 [facility] Patient Information 2014 [employer]  [place].

## 2013-08-09 NOTE — Progress Notes (Unsigned)
Reflux test for esr

## 2013-08-09 NOTE — Progress Notes (Signed)
CC: Right wrist pain follow up Left hand swelling today's  HPI: Patient is here for followup of right-sided carpal tunnel syndrome. Patient did have an injection states that he is approximately 65% better. Patient would like another injection to try to get all the way better if we are in agreement. Patient has been doing the exercises fairly regularly and states that every day it seems to begin moderately better. States he still has some mild numbness but does not have the weakness that he was having previously.  Patient will over the course last 2 days has started having swelling of the left hand. Patient is never had severe swelling of the left hand. Patient has been time to take ibuprofen but is having severe pain with that. Patient denies any new medications are on the over the counter medications. Patient did drink a little more alcohol than usual over the weekend but not excessively. Patient denies any other changes in that. Patient has had this happen before one time but seemed to resolve after 1-2 days. This time it seems to be getting worse.   Past medical, surgical, family and social history reviewed. Medications reviewed all in the electronic medical record.   Review of Systems: No headache, visual changes, nausea, vomiting, diarrhea, constipation, dizziness, abdominal pain, skin rash, fevers, chills, night sweats, weight loss, swollen lymph nodes, body aches, joint swelling, muscle aches, chest pain, shortness of breath, mood changes.   Objective:    Blood pressure 150/88, pulse 84, SpO2 99.00%.   General: No apparent distress alert and oriented x3 mood and affect normal, dressed appropriately.  HEENT: Pupils equal, extraocular movements intact Respiratory: Patient's speak in full sentences and does not appear short of breath Cardiovascular: No lower extremity edema, non tender, no erythema Skin: Warm dry intact with no signs of infection or rash on extremities or on axial  skeleton. Abdomen: Soft nontender Neuro: Cranial nerves II through XII are intact, neurovascularly intact in all extremities with 2+ DTRs and 2+ pulses. Lymph: No lymphadenopathy of posterior or anterior cervical chain or axillae bilaterally.  Gait normal with good balance and coordination.  MSK: Non tender with full range of motion and good stability and symmetric strength and tone of shoulders, elbows, hip, knee and ankles bilaterally.  Wrist: Right Inspection shows the patient does have some swelling of this right hand minimal compared to his contralateral side has severe swelling of the entire hand. ROM smooth and normal with good flexion and extension and ulnar/radial deviation that is symmetrical with opposite wrist. Palpation is normal over metacarpals, navicular, lunate, and TFCC; tendons without tenderness/ swelling No snuffbox tenderness. No tenderness over Canal of Guyon. Strength 5/5 in all directions without pain. Negative Finkelstein,  Mild POSITIVE tinel's and phalens. Negative Watson's test. Patient's left wrist has significant swelling and tenderness especially with extension of the fingers. Patient does not have any significant numbness compared to the contralateral side but has good grip strength that is symmetric. Neurovascularly intact distally.  MSK US performed of: Right wrist This study was ordered, performed, and interpreted by Terrilee Files D.O.  Wrist: All extensor compartments visualized and tendons all normal in appearance without fraying, tears, or sheath effusions. No effusion seen. TFCC intact. Scapholunate ligament intact. Carpal tunnel visualized but does have significant scarring of the nerve sheath surrounding the area and does have an area of 1.91cm down from 1.71 cm from last visit   Power doppler signal normal.  IMPRESSION:  Carpal tunnel syndrome worsening again slowly  Procedure: Real-time Ultrasound Guided Injection of right carpal tunnel Device:  GE Logiq E  Ultrasound guided injection is preferred based studies that show increased duration, increased effect, greater accuracy, decreased procedural pain, increased response rate with ultrasound guided versus blind injection.  Verbal informed consent obtained.  Time-out conducted.  Noted no overlying erythema, induration, or other signs of local infection.  Skin prepped in a sterile fashion.  Local anesthesia: Topical Ethyl chloride.  With sterile technique and under real time ultrasound guidance:  median nerve visualized.  23g 5/8 inch needle inserted distal to proximal approach into nerve sheath. Pictures taken nfor needle placement. Patient did have injection of 2 cc of 1% lidocaine, 1 cc of 0.5% Marcaine, and 1 cc of Kenalog 40 mg/dL. Completed without difficulty  Pain immediately resolved suggesting accurate placement of the medication.  Advised to call if fevers/chills, erythema, induration, drainage, or persistent bleeding.  Images permanently stored and available for review in the ultrasound unit.  Impression: Technically successful ultrasound guided injection.  Left wrist on ultrasound has significant hypoechoic changes of the extensor tendons the patient's median nerve measures 1.63 cm in diameter. Impression: Extensor tenosynovitis questionably secondary to gout     Impression and Recommendations:     This case required medical decision making of moderate complexity.

## 2013-08-09 NOTE — Assessment & Plan Note (Signed)
Patient does have what appears to be carpal tunnel syndrome worsening against we will do another injection. Patient had good results last time and is encouraged to continue deicing protocol, exercises, and bracing at night. Patient is somewhat noncompliant with the other hole modalities to will see if patient improves. Patient has anymore difficulties we may want to consider an EMG for further evaluation and possible need for surgical intervention. Patient will come back again in one week for follow up of his left hand swelling

## 2013-08-09 NOTE — Assessment & Plan Note (Addendum)
Patient's left hand swelling appears to be secondary to more of a tenosynovitis. I did get labs today including uric acid, complete metabolic panel, and thyroid. Go to see if there is any other underlying problem that could be possibly contributing. We also get a sedimentation rate. Patient will try prednisone daily for the next 5 days if this is secondary to gouty to be helpful. Patient was given a shot of Toradol today. Patient will come back in one week for further evaluation. Differential includes autoimmune diseases, does not appear to be cellulitic. Spent greater than 25 minutes with patient face-to-face and had greater than 50% of counseling including as described above in assessment and plan.

## 2013-08-10 LAB — ANA: ANA: POSITIVE — AB

## 2013-08-10 LAB — ANTI-NUCLEAR AB-TITER (ANA TITER): ANA Titer 1: NEGATIVE

## 2013-08-12 ENCOUNTER — Ambulatory Visit: Payer: PRIVATE HEALTH INSURANCE | Admitting: Family Medicine

## 2013-08-16 ENCOUNTER — Ambulatory Visit (INDEPENDENT_AMBULATORY_CARE_PROVIDER_SITE_OTHER): Payer: PRIVATE HEALTH INSURANCE | Admitting: Family Medicine

## 2013-08-16 ENCOUNTER — Encounter: Payer: Self-pay | Admitting: Family Medicine

## 2013-08-16 VITALS — BP 148/78 | HR 94

## 2013-08-16 DIAGNOSIS — M109 Gout, unspecified: Secondary | ICD-10-CM | POA: Insufficient documentation

## 2013-08-16 DIAGNOSIS — G5600 Carpal tunnel syndrome, unspecified upper limb: Secondary | ICD-10-CM

## 2013-08-16 MED ORDER — ALLOPURINOL 100 MG PO TABS: ORAL_TABLET | ORAL | Status: DC

## 2013-08-16 MED ORDER — COLCHICINE 0.6 MG PO TABS: 0.6000 mg | ORAL_TABLET | Freq: Every day | ORAL | Status: DC

## 2013-08-16 NOTE — Assessment & Plan Note (Signed)
I believe patient's pain as well as inflammatory markers is secondary to uric acid buildup. At patient's followup we will check and see if we're bringing him patient's uric acid level. I do believe that preventative medications will be beneficial. Patient was put on allopurinol, warned of potential side effects. Patient will start taking this medication titrate up slowly. We will check labs again in 3 weeks. Patient swelling in his left hand is dramatically better after the prednisone. If patient continues to have flares even with decreasing patient's uric acid level then we need to consider workup for rheumatologic diseases but I'm hoping this will be unlikely necessary.

## 2013-08-16 NOTE — Progress Notes (Signed)
  CC: Right wrist pain follow up Left hand swelling all  HPI: Patient is here for followup of his carpal tunnel on the right hand as well as swelling of his left hand. Patient last time had swelling of his left hand that was extremely tender to palpation and we started him on prednisone. Patient states on day 5 of the prednisone his pain was completely resolved all over his body. Patient states that the swelling has gone down and just over the course last it started coming back slowly. Patient denies any new symptoms. Patient's concern though that it may be coming back and would like to know if he could be on prednisone longer does.  Patient states that the carpal tunnel on the right hand has improved significantly where he now has feeling in the fourth and third fingers which he used to have complete numbness. Patient is able to use his hand more and notices his strength is somewhat better appear   Past medical, surgical, family and social history reviewed. Medications reviewed all in the electronic medical record.   Review of Systems: No headache, visual changes, nausea, vomiting, diarrhea, constipation, dizziness, abdominal pain, skin rash, fevers, chills, night sweats, weight loss, swollen lymph nodes, body aches, joint swelling, muscle aches, chest pain, shortness of breath, mood changes.   Objective:    Blood pressure 148/78, pulse 94, SpO2 97.00%.   General: No apparent distress alert and oriented x3 mood and affect normal, dressed appropriately.  HEENT: Pupils equal, extraocular movements intact Respiratory: Patient's speak in full sentences and does not appear short of breath Cardiovascular: No lower extremity edema, non tender, no erythema Skin: Warm dry intact with no signs of infection or rash on extremities or on axial skeleton. Abdomen: Soft nontender Neuro: Cranial nerves II through XII are intact, neurovascularly intact in all extremities with 2+ DTRs and 2+ pulses. Lymph: No  lymphadenopathy of posterior or anterior cervical chain or axillae bilaterally.  Gait normal with good balance and coordination.  MSK: Non tender with full range of motion and good stability and symmetric strength and tone of shoulders, elbows, hip, knee and ankles bilaterally.  Wrist: Right Inspection shows the patient no swelling.  ROM smooth and normal with good flexion and extension and ulnar/radial deviation that is symmetrical with opposite wrist. Palpation is normal over metacarpals, navicular, lunate, and TFCC; tendons without tenderness/ swelling No snuffbox tenderness. No tenderness over Canal of Guyon. Strength 5/5 in all directions without pain. Negative Finkelstein,  Mild POSITIVE tinel's and phalens. Negative Watson's test. Left hand has no swelling today. Full ROM.       Impression and Recommendations:     This case required medical decision making of moderate complexity.

## 2013-08-16 NOTE — Assessment & Plan Note (Signed)
Patient had significant improvement after last injection again. Patient is starting to have the feeling come back in the right hand. 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

## 2013-08-16 NOTE — Patient Instructions (Addendum)
Good to see you Try allopurinol daily.  Start at 100mg  for first week, then 200mg  second week, then 300 mg therafter.  Colchicine for the next 5 days.  Continue the vitamin D , multi vitamin and turmeric See me again in 3 weeks. We will check you uric acid level again at that time.   Gout Gout is an inflammatory arthritis caused by a buildup of uric acid crystals in the joints. Uric acid is a chemical that is normally present in the blood. When the level of uric acid in the blood is too high it can form crystals that deposit in your joints and tissues. This causes joint redness, soreness, and swelling (inflammation). Repeat attacks are common. Over time, uric acid crystals can form into masses (tophi) near a joint, destroying bone and causing disfigurement. Gout is treatable and often preventable. CAUSES  The disease begins with elevated levels of uric acid in the blood. Uric acid is produced by your body when it breaks down a naturally found substance called purines. Certain foods you eat, such as meats and fish, contain high amounts of purines. Causes of an elevated uric acid level include:  Being passed down from parent to child (heredity).  Diseases that cause increased uric acid production (such as obesity, psoriasis, and certain cancers).  Excessive alcohol use.  Diet, especially diets rich in meat and seafood.  Medicines, including certain cancer-fighting medicines (chemotherapy), water pills (diuretics), and aspirin.  Chronic kidney disease. The kidneys are no longer able to remove uric acid well.  Problems with metabolism. Conditions strongly associated with gout include:  Obesity.  High blood pressure.  High cholesterol.  Diabetes. Not everyone with elevated uric acid levels gets gout. It is not understood why some people get gout and others do not. Surgery, joint injury, and eating too much of certain foods are some of the factors that can lead to gout attacks. SYMPTOMS     An attack of gout comes on quickly. It causes intense pain with redness, swelling, and warmth in a joint.  Fever can occur.  Often, only one joint is involved. Certain joints are more commonly involved:  Base of the big toe.  Knee.  Ankle.  Wrist.  Finger. Without treatment, an attack usually goes away in a few days to weeks. Between attacks, you usually will not have symptoms, which is different from many other forms of arthritis. DIAGNOSIS  Your caregiver will suspect gout based on your symptoms and exam. In some cases, tests may be recommended. The tests may include:  Blood tests.  Urine tests.  X-rays.  Joint fluid exam. This exam requires a needle to remove fluid from the joint (arthrocentesis). Using a microscope, gout is confirmed when uric acid crystals are seen in the joint fluid. TREATMENT  There are two phases to gout treatment: treating the sudden onset (acute) attack and preventing attacks (prophylaxis).  Treatment of an Acute Attack.  Medicines are used. These include anti-inflammatory medicines or steroid medicines.  An injection of steroid medicine into the affected joint is sometimes necessary.  The painful joint is rested. Movement can worsen the arthritis.  You may use warm or cold treatments on painful joints, depending which works best for you.  Treatment to Prevent Attacks.  If you suffer from frequent gout attacks, your caregiver may advise preventive medicine. These medicines are started after the acute attack subsides. These medicines either help your kidneys eliminate uric acid from your body or decrease your uric acid production. You  may need to stay on these medicines for a very long time.  The early phase of treatment with preventive medicine can be associated with an increase in acute gout attacks. For this reason, during the first few months of treatment, your caregiver may also advise you to take medicines usually used for acute gout  treatment. Be sure you understand your caregiver's directions. Your caregiver may make several adjustments to your medicine dose before these medicines are effective.  Discuss dietary treatment with your caregiver or dietitian. Alcohol and drinks high in sugar and fructose and foods such as meat, poultry, and seafood can increase uric acid levels. Your caregiver or dietician can advise you on drinks and foods that should be limited. HOME CARE INSTRUCTIONS   Do not take aspirin to relieve pain. This raises uric acid levels.  Only take over-the-counter or prescription medicines for pain, discomfort, or fever as directed by your caregiver.  Rest the joint as much as possible. When in bed, keep sheets and blankets off painful areas.  Keep the affected joint raised (elevated).  Apply warm or cold treatments to painful joints. Use of warm or cold treatments depends on which works best for you.  Use crutches if the painful joint is in your leg.  Drink enough fluids to keep your urine clear or pale yellow. This helps your body get rid of uric acid. Limit alcohol, sugary drinks, and fructose drinks.  Follow your dietary instructions. Pay careful attention to the amount of protein you eat. Your daily diet should emphasize fruits, vegetables, whole grains, and fat-free or low-fat milk products. Discuss the use of coffee, vitamin C, and cherries with your caregiver or dietician. These may be helpful in lowering uric acid levels.  Maintain a healthy body weight. SEEK MEDICAL CARE IF:   You develop diarrhea, vomiting, or any side effects from medicines.  You do not feel better in 24 hours, or you are getting worse. SEEK IMMEDIATE MEDICAL CARE IF:   Your joint becomes suddenly more tender, and you have chills or a fever. MAKE SURE YOU:   Understand these instructions.  Will watch your condition.  Will get help right away if you are not doing well or get worse. Document Released: 05/03/2000  Document Revised: 08/31/2012 Document Reviewed: 12/18/2011 [facility] CenterExitCare Patient Information 2014 [place]Care, [place].

## 2013-09-08 ENCOUNTER — Encounter: Payer: Self-pay | Admitting: Family Medicine

## 2013-09-08 ENCOUNTER — Ambulatory Visit (INDEPENDENT_AMBULATORY_CARE_PROVIDER_SITE_OTHER): Payer: PRIVATE HEALTH INSURANCE | Admitting: Family Medicine

## 2013-09-08 VITALS — BP 140/72 | HR 84

## 2013-09-08 DIAGNOSIS — M109 Gout, unspecified: Secondary | ICD-10-CM

## 2013-09-08 DIAGNOSIS — K602 Anal fissure, unspecified: Secondary | ICD-10-CM | POA: Insufficient documentation

## 2013-09-08 MED ORDER — HYDROCORTISONE ACETATE 25 MG RE SUPP: 25.0000 mg | Freq: Two times a day (BID) | RECTAL | Status: DC

## 2013-09-08 NOTE — Patient Instructions (Signed)
Decrease allopurinol to 200mg  daily. IF still tired after 1 week then can try 100mg  daily. If swelling or paoin returns need to go back up.  See me when you need me. 3 weeks if not perfect come see me again.  Suppository to help with hemmoroid and anal fissure.  Anal Fissure, Adult An anal fissure is a small tear or crack in the skin around the anus. Bleeding from a fissure usually stops on its own within a few minutes. However, bleeding will often reoccur with each bowel movement until the crack heals.  CAUSES   Passing large, hard stools.  Frequent diarrheal stools.  Constipation.  Inflammatory bowel disease (Crohn's disease or ulcerative colitis).  Infections.  Anal sex. SYMPTOMS   Small amounts of blood seen on your stools, on toilet paper, or in the toilet after a bowel movement.  Rectal bleeding.  Painful bowel movements.  Itching or irritation around the anus. DIAGNOSIS Your caregiver will examine the anal area. An anal fissure can usually be seen with careful inspection. A rectal exam may be performed and a short tube (anoscope) may be used to examine the anal canal. TREATMENT   You may be instructed to take fiber supplements. These supplements can soften your stool to help make bowel movements easier.  Sitz baths may be recommended to help heal the tear. Do not use soap in the sitz baths.  A medicated cream or ointment may be prescribed to lessen discomfort. HOME CARE INSTRUCTIONS   Maintain a diet high in fruits, whole grains, and vegetables. Avoid constipating foods like bananas and dairy products.  Take sitz baths as directed by your caregiver.  Drink enough fluids to keep your urine clear or pale yellow.  Only take over-the-counter or prescription medicines for pain, discomfort, or fever as directed by your caregiver. Do not take aspirin as this may increase bleeding.  Do not use ointments containing numbing medications (anesthetics) or hydrocortisone. They  could slow healing. SEEK MEDICAL CARE IF:   Your fissure is not completely healed within 3 days.  You have further bleeding.  You have a fever.  You have diarrhea mixed with blood.  You have pain.  Your problem is getting worse rather than better. MAKE SURE YOU:   Understand these instructions.  Will watch your condition.  Will get help right away if you are not doing well or get worse. Document Released: 05/06/2005 Document Revised: 07/29/2011 Document Reviewed: 10/21/2010 [facility] Patient Information 2014 [place]ExitCare, [place].

## 2013-09-08 NOTE — Assessment & Plan Note (Signed)
Suppositories given. We discussed I changes the patient was given a handout. Patient continues to have trouble we will refer to surgery for further evaluation.

## 2013-09-08 NOTE — Assessment & Plan Note (Signed)
Patient does have gout and we will decrease patient's allopurinol 200 mg daily and see if patient tolerated. Patient continues to have symptoms on he'll come back and we'll get a CBC another blood test to make sure that this is not a side effect from the allopurinol. I did think this is highly unlikely though. Patient has been feeling better overall which is a wonderful. Patient will come back again in 3 weeks if not completely perfect. Discuss with uric acid is and we discussed proper diet. Spent greater than 25 minutes with patient face-to-face and had greater than 50% of counseling including as described above in assessment and plan.

## 2013-09-08 NOTE — Progress Notes (Signed)
  CC: Right wrist pain follow up Left hand swelling follow up  HPI: Patient was seen previously and was diagnosed with what appeared to be more of a gouty tenosynovitis contribute to his carpal tunnel. Patient was put on colchicine short term and has been on allopurinol slowly titrate up to 300 mg daily. Patient states that he has been doing fairly well but has noticed that he is significantly tired at the end of long days while taking 300 mg. Patient states he did not have a side effect when he is at 200 mg daily. Patient states he is having no pain and no numbness in the hands and states that all his joints feel much better.  Patient is complaining of some rectal pain no. Patient states that it has been approximately one week. Patient has had some trouble with constipation and is now having trouble even with sitting. Patient has been trying preparation H. with no significant improvement. Patient denies any blood in the stool, denies any black tarry stool. Denies any fevers or chills or any abnormal weight loss. No pain with urination.   Past medical, surgical, family and social history reviewed. Medications reviewed all in the electronic medical record.   Review of Systems: No headache, visual changes, nausea, vomiting, diarrhea, constipation, dizziness, abdominal pain, skin rash, fevers, chills, night sweats, weight loss, swollen lymph nodes, body aches, joint swelling, muscle aches, chest pain, shortness of breath, mood changes.   Objective:    Blood pressure 140/72, pulse 84, SpO2 98.00%.   General: No apparent distress alert and oriented x3 mood and affect normal, dressed appropriately.  HEENT: Pupils equal, extraocular movements intact Respiratory: Patient's speak in full sentences and does not appear short of breath Cardiovascular: No lower extremity edema, non tender, no erythema Skin: Warm dry intact with no signs of infection or rash on extremities or on axial skeleton. Abdomen: Soft  nontender Neuro: Cranial nerves II through XII are intact, neurovascularly intact in all extremities with 2+ DTRs and 2+ pulses. Lymph: No lymphadenopathy of posterior or anterior cervical chain or axillae bilaterally.  Gait normal with good balance and coordination.  MSK: Non tender with full range of motion and good stability and symmetric strength and tone of shoulders, elbows, hip, knee and ankles bilaterally.  Wrist: Right Inspection shows the patient no swelling.  ROM smooth and normal with good flexion and extension and ulnar/radial deviation that is symmetrical with opposite wrist. Palpation is normal over metacarpals, navicular, lunate, and TFCC; tendons without tenderness/ swelling No snuffbox tenderness. No tenderness over Canal of Guyon. Strength 5/5 in all directions without pain. Negative Finkelstein,  Negative tinel's and phalens. Negative Watson's test. Left hand no swelling.  Rectal exam shows the patient does have a small anal fissure that is very tender to palpation. No significant hemorrhoids palpated.      Impression and Recommendations:     This case required medical decision making of moderate complexity.

## 2013-10-02 ENCOUNTER — Other Ambulatory Visit: Payer: Self-pay | Admitting: Family Medicine

## 2013-11-05 ENCOUNTER — Ambulatory Visit (INDEPENDENT_AMBULATORY_CARE_PROVIDER_SITE_OTHER): Payer: PRIVATE HEALTH INSURANCE | Admitting: Family Medicine

## 2013-11-05 ENCOUNTER — Encounter: Payer: Self-pay | Admitting: Family Medicine

## 2013-11-05 VITALS — BP 142/78 | HR 77 | Ht 69.5 in | Wt 184.0 lb

## 2013-11-05 DIAGNOSIS — M1A09X0 Idiopathic chronic gout, multiple sites, without tophus (tophi): Secondary | ICD-10-CM

## 2013-11-05 DIAGNOSIS — M1A00X0 Idiopathic chronic gout, unspecified site, without tophus (tophi): Secondary | ICD-10-CM

## 2013-11-05 NOTE — Assessment & Plan Note (Signed)
Patient is doing remarkable well right now. Encouraged patient continue to watch his diet and if he has any flares we'll consider starting the medications again with colchicine and allopurinol. Patient will follow up on an as necessary basis

## 2013-11-05 NOTE — Progress Notes (Signed)
  CC: Right wrist pain follow up Left hand swelling follow up, secondary to gout.   HPI: Patient was seen previously and was diagnosed with what appeared to be more of a gouty tenosynovitis contribute to his carpal tunnel. Patient actually stopped taking the allopurinol and states that he is still feeling good. Patient is able to do all activities of daily living including his [employer] work. Overall patient has no complaints, continues to take vitamins including turmeric and is feeling good.     Past medical, surgical, family and social history reviewed. Medications reviewed all in the electronic medical record.   Review of Systems: No headache, visual changes, nausea, vomiting, diarrhea, constipation, dizziness, abdominal pain, skin rash, fevers, chills, night sweats, weight loss, swollen lymph nodes, body aches, joint swelling, muscle aches, chest pain, shortness of breath, mood changes.   Objective:    Blood pressure 142/78, pulse 77, height 5' 9.5" (1.765 m), weight 184 lb (83.462 kg), SpO2 99.00%.   General: No apparent distress alert and oriented x3 mood and affect normal, dressed appropriately.  HEENT: Pupils equal, extraocular movements intact Respiratory: Patient's speak in full sentences and does not appear short of breath Cardiovascular: No lower extremity edema, non tender, no erythema Skin: Warm dry intact with no signs of infection or rash on extremities or on axial skeleton. Abdomen: Soft nontender Neuro: Cranial nerves II through XII are intact, neurovascularly intact in all extremities with 2+ DTRs and 2+ pulses. Lymph: No lymphadenopathy of posterior or anterior cervical chain or axillae bilaterally.  Gait normal with good balance and coordination.  MSK: Non tender with full range of motion and good stability and symmetric strength and tone of shoulders, elbows, hip, knee and ankles bilaterally.  Wrist: Right Inspection shows the patient no swelling.  ROM smooth and  normal with good flexion and extension and ulnar/radial deviation that is symmetrical with opposite wrist. Palpation is normal over metacarpals, navicular, lunate, and TFCC; tendons without tenderness/ swelling No snuffbox tenderness. No tenderness over Canal of Guyon. Strength 5/5 in all directions without pain. Negative Finkelstein,  Negative tinel's and phalens. Negative Watson's test. Left hand no swelling.     Impression and Recommendations:     This case required medical decision making of moderate complexity.

## 2014-08-15 ENCOUNTER — Telehealth: Payer: Self-pay | Admitting: Internal Medicine

## 2014-08-15 ENCOUNTER — Encounter: Payer: Self-pay | Admitting: Family

## 2014-08-15 ENCOUNTER — Ambulatory Visit (INDEPENDENT_AMBULATORY_CARE_PROVIDER_SITE_OTHER): Payer: PRIVATE HEALTH INSURANCE | Admitting: Family

## 2014-08-15 VITALS — BP 160/92 | HR 89 | Temp 98.6°F | Resp 18 | Ht 69.5 in | Wt 192.8 lb

## 2014-08-15 DIAGNOSIS — R0683 Snoring: Secondary | ICD-10-CM

## 2014-08-15 DIAGNOSIS — Z0000 Encounter for general adult medical examination without abnormal findings: Secondary | ICD-10-CM

## 2014-08-15 DIAGNOSIS — L930 Discoid lupus erythematosus: Secondary | ICD-10-CM

## 2014-08-15 DIAGNOSIS — Z0001 Encounter for general adult medical examination with abnormal findings: Secondary | ICD-10-CM | POA: Insufficient documentation

## 2014-08-15 MED ORDER — HYDROXYCHLOROQUINE SULFATE 200 MG PO TABS: 200.0000 mg | ORAL_TABLET | Freq: Two times a day (BID) | ORAL | Status: DC

## 2014-08-15 NOTE — Assessment & Plan Note (Signed)
Previously seen by dermatology for what was thought at the time of assessment to be folliculitis. Diagnosis was lupus erythematous. Was given one month of Plaquenil. Patient is requesting a refill of this medication as he has noticed similar spots returning. Start plaquenil. Follow up if symptoms worsen or fail to improve.

## 2014-08-15 NOTE — Telephone Encounter (Signed)
emmi mailed  °

## 2014-08-15 NOTE — Progress Notes (Signed)
Pre visit review using our clinic review tool, if applicable. No additional management support is needed unless otherwise documented below in the visit note. 

## 2014-08-15 NOTE — Assessment & Plan Note (Signed)
No obvious etiology of snoring. Recommend conservative treatment at this time through over-the-counter measures including Breathe Right. Given no daytime fatigue or potential apnea, defer sleep study at this time. Follow up if symptoms worsen or fail to improve.

## 2014-08-15 NOTE — Progress Notes (Signed)
Subjective:    Patient ID: Kyle Morse, male    DOB: 21-Aug-1960, 54 y.o.   MRN: [id number]  Chief Complaint  Patient presents with  . Follow-up    Snoring at night wants to talk about that, and lupus medicine    HPI:  Kyle Morse is a 54 y.o. male who presents today for an annual wellness visit.   1) Health Maintenance -   Diet - Average about 3-4 meals per day; Meals consist of some vegetables, generally meats and potatoes. Caffeine: 3-4 cups    Exercise - Work; No structured outside of work.    2) Preventative Exams / Immunizations:  Dental -- Due for exam  Vision -- Up to date   Health Maintenance  Topic Date Due  . INFLUENZA VACCINE  01/19/2015 (Originally 12/18/2013)  . HIV Screening  07/18/2015 (Originally 06/04/1973)  . COLONOSCOPY  08/15/2015 (Originally 06/04/2008)  . TETANUS/TDAP  08/15/2015 (Originally 06/04/1977)  . Hepatitis C Screening  08/15/2015 (Originally Dec 03, 1960)     There is no immunization history on file for this patient.   Review of Systems  Constitutional: Denies fever, chills, fatigue, or significant weight gain/loss. HENT: Head: Denies headache or neck pain Ears: Denies changes in hearing, ringing in ears, earache, drainage Nose: Denies discharge, stuffiness, itching, nosebleed, sinus pain Throat: Denies sore throat, hoarseness, dry mouth, sores, thrush Eyes: Denies loss/changes in vision, pain, redness, blurry/double vision, flashing lights Cardiovascular: Denies chest pain/discomfort, tightness, palpitations, shortness of breath with activity, difficulty lying down, swelling, sudden awakening with shortness of breath Respiratory: Denies shortness of breath, cough, sputum production, wheezing Gastrointestinal: Denies dysphasia, heartburn, change in appetite, nausea, change in bowel habits, rectal bleeding, constipation, diarrhea, yellow skin or eyes Genitourinary: Denies frequency, urgency, burning/pain, blood in urine,  incontinence, change in urinary strength. Musculoskeletal: Denies muscle/joint pain, stiffness, back pain, redness or swelling of joints, trauma Skin: Denies, lumps, itching, dryness, color changes, or hair/nail changes Previously diagnosed with Lupus Errythmatous. Is currently experiencing symptoms of increased rash Neurological: Denies dizziness, fainting, seizures, weakness, numbness, tingling, tremor Psychiatric - Denies nervousness, stress, depression or memory loss Associated symptom of snoring has been going on for at least 2 the last 2 years. Denies any daytime sleepiness or drowsiness. Denies that he stops breathing during the night. Denies any modifying factors that he tried.  Endocrine: Denies heat or cold intolerance, sweating, frequent urination, excessive thirst, changes in appetite Hematologic: Denies ease of bruising or bleeding     Objective:     BP 160/92 mmHg  Pulse 89  Temp(Src) 98.6 F (37 C) (Oral)  Resp 18  Ht 5' 9.5" (1.765 m)  Wt 192 lb 12.8 oz (87.454 kg)  BMI 28.07 kg/m2  SpO2 98% Nursing note and vital signs reviewed.  Physical Exam  Constitutional: He is oriented to person, place, and time. He appears well-developed and well-nourished.  HENT:  Head: Normocephalic.  Right Ear: Hearing, tympanic membrane, external ear and ear canal normal.  Left Ear: Hearing, tympanic membrane, external ear and ear canal normal.  Nose: Nose normal.  Mouth/Throat: Uvula is midline, oropharynx is clear and moist and mucous membranes are normal.  Eyes: Conjunctivae and EOM are normal. Pupils are equal, round, and reactive to light.  Neck: Neck supple. No JVD present. No tracheal deviation present. No thyromegaly present.  Cardiovascular: Normal rate, regular rhythm, normal heart sounds and intact distal pulses.   Pulmonary/Chest: Effort normal and breath sounds normal.  Abdominal: Soft. Bowel sounds are normal. He  exhibits no distension and no mass. There is no tenderness.  There is no rebound and no guarding.  Musculoskeletal: Normal range of motion. He exhibits no edema or tenderness.  Lymphadenopathy:    He has no cervical adenopathy.  Neurological: He is alert and oriented to person, place, and time. He has normal reflexes. No cranial nerve deficit. He exhibits normal muscle tone. Coordination normal.  Skin: Skin is warm and dry.  Psychiatric: He has a normal mood and affect. His behavior is normal. Judgment and thought content normal.       Assessment & Plan:

## 2014-08-15 NOTE — Patient Instructions (Signed)
Thank you for choosing [facility].  Summary/Instructions:  Your prescription(s) have been submitted to your pharmacy or been printed and provided for you. Please take as directed and contact our office if you believe you are having problem(s) with the medication(s) or have any questions.  Please stop by the lab on the basement level of the building for your blood work. Your results will be released to MyChart (or called to you) after review, usually within 72 hours after test completion. If any changes need to be made, you will be notified at that same time.  Health Maintenance A healthy lifestyle and preventative care can promote health and wellness.  Maintain regular health, dental, and eye exams.  Eat a healthy diet. Foods like vegetables, fruits, whole grains, low-fat dairy products, and lean protein foods contain the nutrients you need and are low in calories. Decrease your intake of foods high in solid fats, added sugars, and salt. Get information about a proper diet from your health care provider, if necessary.  Regular physical exercise is one of the most important things you can do for your health. Most adults should get at least 150 minutes of moderate-intensity exercise (any activity that increases your heart rate and causes you to sweat) each week. In addition, most adults need muscle-strengthening exercises on 2 or more days a week.   Maintain a healthy weight. The body mass index (BMI) is a screening tool to identify possible weight problems. It provides an estimate of body fat based on height and weight. Your health care provider can find your BMI and can help you achieve or maintain a healthy weight. For males 20 years and older:  A BMI below 18.5 is considered underweight.  A BMI of 18.5 to 24.9 is normal.  A BMI of 25 to 29.9 is considered overweight.  A BMI of 30 and above is considered obese.  Maintain normal blood lipids and cholesterol by exercising and  minimizing your intake of saturated fat. Eat a balanced diet with plenty of fruits and vegetables. Blood tests for lipids and cholesterol should begin at age 20 and be repeated every 5 years. If your lipid or cholesterol levels are high, you are over age 50, or you are at high risk for heart disease, you may need your cholesterol levels checked more frequently.Ongoing high lipid and cholesterol levels should be treated with medicines if diet and exercise are not working.  If you smoke, find out from your health care provider how to quit. If you do not use tobacco, do not start.  Lung cancer screening is recommended for adults aged 55-80 years who are at high risk for developing lung cancer because of a history of smoking. A yearly low-dose CT scan of the lungs is recommended for people who have at least a 30-pack-year history of smoking and are current smokers or have quit within the past 15 years. A pack year of smoking is smoking an average of 1 pack of cigarettes a day for 1 year (for example, a 30-pack-year history of smoking could mean smoking 1 pack a day for 30 years or 2 packs a day for 15 years). Yearly screening should continue until the smoker has stopped smoking for at least 15 years. Yearly screening should be stopped for people who develop a health problem that would prevent them from having lung cancer treatment.  If you choose to drink alcohol, do not have more than 2 drinks per day. One drink is considered to be 12   oz (360 mL) of beer, 5 oz (150 mL) of wine, or 1.5 oz (45 mL) of liquor.  Avoid the use of street drugs. Do not share needles with anyone. Ask for help if you need support or instructions about stopping the use of drugs.  High blood pressure causes heart disease and increases the risk of stroke. Blood pressure should be checked at least every 1-2 years. Ongoing high blood pressure should be treated with medicines if weight loss and exercise are not effective.  If you are  57-73 years old, ask your health care provider if you should take aspirin to prevent heart disease.  Diabetes screening involves taking a blood sample to check your fasting blood sugar level. This should be done once every 3 years after age 52 if you are at a normal weight and without risk factors for diabetes. Testing should be considered at a younger age or be carried out more frequently if you are overweight and have at least 1 risk factor for diabetes.  Colorectal cancer can be detected and often prevented. Most routine colorectal cancer screening begins at the age of 71 and continues through age 40. However, your health care provider may recommend screening at an earlier age if you have risk factors for colon cancer. On a yearly basis, your health care provider may provide home test kits to check for hidden blood in the stool. A small camera at the end of a tube may be used to directly examine the colon (sigmoidoscopy or colonoscopy) to detect the earliest forms of colorectal cancer. Talk to your health care provider about this at age 106 when routine screening begins. A direct exam of the colon should be repeated every 5-10 years through age 73, unless early forms of precancerous polyps or small growths are found.  People who are at an increased risk for hepatitis B should be screened for this virus. You are considered at high risk for hepatitis B if:  You were born in a country where hepatitis B occurs often. Talk with your health care provider about which countries are considered high risk.  Your parents were born in a high-risk country and you have not received a shot to protect against hepatitis B (hepatitis B vaccine).  You have HIV or AIDS.  You use needles to inject street drugs.  You live with, or have sex with, someone who has hepatitis B.  You are a man who has sex with other men (MSM).  You get hemodialysis treatment.  You take certain medicines for conditions like cancer, organ  transplantation, and autoimmune conditions.  Hepatitis C blood testing is recommended for all people born from [birth year] through 1967 and any individual with known risk factors for hepatitis C.  Healthy men should no longer receive prostate-specific antigen (PSA) blood tests as part of routine cancer screening. Talk to your health care provider about prostate cancer screening.  Testicular cancer screening is not recommended for adolescents or adult males who have no symptoms. Screening includes self-exam, a health care provider exam, and other screening tests. Consult with your health care provider about any symptoms you have or any concerns you have about testicular cancer.  Practice safe sex. Use condoms and avoid high-risk sexual practices to reduce the spread of sexually transmitted infections (STIs).  You should be screened for STIs, including gonorrhea and chlamydia if:  You are sexually active and are younger than 24 years.  You are older than 24 years, and your health care provider tells  you that you are at risk for this type of infection.  Your sexual activity has changed since you were last screened, and you are at an increased risk for chlamydia or gonorrhea. Ask your health care provider if you are at risk.  If you are at risk of being infected with HIV, it is recommended that you take a prescription medicine daily to prevent HIV infection. This is called pre-exposure prophylaxis (PrEP). You are considered at risk if:  You are a man who has sex with other men (MSM).  You are a heterosexual man who is sexually active with multiple partners.  You take drugs by injection.  You are sexually active with a partner who has HIV.  Talk with your health care provider about whether you are at high risk of being infected with HIV. If you choose to begin PrEP, you should first be tested for HIV. You should then be tested every 3 months for as long as you are taking PrEP.  Use sunscreen. Apply  sunscreen liberally and repeatedly throughout the day. You should seek shade when your shadow is shorter than you. Protect yourself by wearing long sleeves, pants, a wide-brimmed hat, and sunglasses year round whenever you are outdoors.  Tell your health care provider of new moles or changes in moles, especially if there is a change in shape or color. Also, tell your health care provider if a mole is larger than the size of a pencil eraser.  A one-time screening for abdominal aortic aneurysm (AAA) and surgical repair of large AAAs by ultrasound is recommended for men aged 65-75 years who are current or former smokers.  Stay current with your vaccines (immunizations). Document Released: 11/02/2007 Document Revised: 05/11/2013 Document Reviewed: 10/01/2010 [facility] Patient Information 2015 [place], [place]. This information is not intended to replace advice given to you by your health care provider. Make sure you discuss any questions you have with your health care provider.  Colonoscopy A colonoscopy is an exam to look at the entire large intestine (colon). This exam can help find problems such as tumors, polyps, inflammation, and areas of bleeding. The exam takes about 1 hour.  LET [facility] CARE PROVIDER KNOW ABOUT:   Any allergies you have.  All medicines you are taking, including vitamins, herbs, eye drops, creams, and over-the-counter medicines.  Previous problems you or members of your family have had with the use of anesthetics.  Any blood disorders you have.  Previous surgeries you have had.  Medical conditions you have. RISKS AND COMPLICATIONS  Generally, this is a safe procedure. However, as with any procedure, complications can occur. Possible complications include:  Bleeding.  Tearing or rupture of the colon wall.  Reaction to medicines given during the exam.  Infection (rare). BEFORE THE PROCEDURE   Ask your health care provider about changing or stopping your  regular medicines.  You may be prescribed an oral bowel prep. This involves drinking a large amount of medicated liquid, starting the day before your procedure. The liquid will cause you to have multiple loose stools until your stool is almost clear or light green. This cleans out your colon in preparation for the procedure.  Do not eat or drink anything else once you have started the bowel prep, unless your health care provider tells you it is safe to do so.  Arrange for someone to drive you home after the procedure. PROCEDURE   You will be given medicine to help you relax (sedative).  You will lie  on your side with your knees bent.  A long, flexible tube with a light and camera on the end (colonoscope) will be inserted through the rectum and into the colon. The camera sends video back to a computer screen as it moves through the colon. The colonoscope also releases carbon dioxide gas to inflate the colon. This helps your health care provider see the area better.  During the exam, your health care provider may take a small tissue sample (biopsy) to be examined under a microscope if any abnormalities are found.  The exam is finished when the entire colon has been viewed. AFTER THE PROCEDURE   Do not drive for 24 hours after the exam.  You may have a small amount of blood in your stool.  You may pass moderate amounts of gas and have mild abdominal cramping or bloating. This is caused by the gas used to inflate your colon during the exam.  Ask when your test results will be ready and how you will get your results. Make sure you get your test results. Document Released: 05/03/2000 Document Revised: 02/24/2013 Document Reviewed: 01/11/2013 [facility] Patient Information 2015 [place]. This information is not intended to replace advice given to you by your health care provider. Make sure you discuss any questions you have with your health care provider.

## 2014-08-15 NOTE — Assessment & Plan Note (Signed)
1) Anticipatory Guidance: Discussed importance of wearing a seatbelt while driving and not texting while driving; changing batteries in smoke detector at least once annually; wearing suntan lotion when outside; eating a balanced and moderate diet; getting physical activity at least 30 minutes per day.  2) Immunizations / Screenings / Labs:  Patient declined influenza and tetanus vaccinations. All other vaccinations are up-to-date per recommendations. Patient is due for a dental visit which she will schedule. He is also due for a colonoscopy which she declined at this time. Discussed the risks and benefits of the associated colonoscopy and patient indicates that he will think about it. Obtain CBC, BMET, PSA, Lipid profile and TSH when fasting.    Overall well exam. Patient's major risk factors include excessive alcohol consumption, increase blood pressure and cigar smoking. Discussed importance of decreasing and quitting cigar smoking. Patient is willing to work on this on his own. Also discussed decreasing drinking 2 a maximum of the recommended 2 drinks per night. Patient is open to this suggestion. Patient's blood pressure was elevated today, however previous readings were within goal of 140/90. Hold off on medication at this time. Recheck pending lab work. Follow-up prevention exam in 1 year. Follow-up office visit pending lab work.

## 2014-08-30 ENCOUNTER — Other Ambulatory Visit (INDEPENDENT_AMBULATORY_CARE_PROVIDER_SITE_OTHER): Payer: PRIVATE HEALTH INSURANCE

## 2014-08-30 DIAGNOSIS — Z0000 Encounter for general adult medical examination without abnormal findings: Secondary | ICD-10-CM | POA: Diagnosis not present

## 2014-08-30 LAB — CBC
HCT: 38.2 % — ABNORMAL LOW (ref 39.0–52.0)
Hemoglobin: 12.9 g/dL — ABNORMAL LOW (ref 13.0–17.0)
MCHC: 33.7 g/dL (ref 30.0–36.0)
MCV: 97.9 fl (ref 78.0–100.0)
Platelets: 237 10*3/uL (ref 150.0–400.0)
RBC: 3.9 Mil/uL — AB (ref 4.22–5.81)
RDW: 12.8 % (ref 11.5–15.5)
WBC: 5.5 10*3/uL (ref 4.0–10.5)

## 2014-08-30 LAB — BASIC METABOLIC PANEL
BUN: 14 mg/dL (ref 6–23)
CHLORIDE: 105 meq/L (ref 96–112)
CO2: 29 mEq/L (ref 19–32)
Calcium: 9.3 mg/dL (ref 8.4–10.5)
Creatinine, Ser: 1.02 mg/dL (ref 0.40–1.50)
GFR: 80.23 mL/min (ref >60.00)
Glucose, Bld: 84 mg/dL (ref 70–99)
POTASSIUM: 4.6 meq/L (ref 3.5–5.1)
Sodium: 138 mEq/L (ref 135–145)

## 2014-08-30 LAB — LIPID PANEL
CHOL/HDL RATIO: 3
Cholesterol: 117 mg/dL (ref 0–200)
HDL: 45.5 mg/dL (ref >39.00)
LDL Cholesterol: 62 mg/dL (ref 0–99)
NonHDL: 71.5
TRIGLYCERIDES: 47 mg/dL (ref 0.0–149.0)
VLDL: 9.4 mg/dL (ref 0.0–40.0)

## 2014-08-30 LAB — TSH: TSH: 1.65 u[IU]/mL (ref 0.35–4.50)

## 2014-08-30 LAB — PSA: PSA: 0.33 ng/mL (ref 0.10–4.00)

## 2014-09-01 ENCOUNTER — Telehealth: Payer: Self-pay | Admitting: Family

## 2014-09-01 NOTE — Telephone Encounter (Signed)
Please inform the patient that his lab work shows his kidney function, electrolyte, thyroid, cholesterol and PSA were all within the normal limits. Plan to follow up in 1 year for wellness exam.

## 2014-09-01 NOTE — Telephone Encounter (Signed)
Pt aware of results 

## 2014-09-08 ENCOUNTER — Other Ambulatory Visit: Payer: Self-pay | Admitting: Family

## 2014-11-28 ENCOUNTER — Other Ambulatory Visit: Payer: Self-pay | Admitting: Family

## 2014-12-26 ENCOUNTER — Telehealth: Payer: Self-pay | Admitting: *Deleted

## 2014-12-26 MED ORDER — TADALAFIL 20 MG PO TABS: 20.0000 mg | ORAL_TABLET | Freq: Every day | ORAL | Status: DC | PRN

## 2014-12-26 NOTE — Telephone Encounter (Signed)
Pt is requesting refill on his cialis. Verified pharmacy. Inform pt will send to [facility].../lmb

## 2015-09-13 ENCOUNTER — Ambulatory Visit (INDEPENDENT_AMBULATORY_CARE_PROVIDER_SITE_OTHER): Payer: PRIVATE HEALTH INSURANCE | Admitting: Family

## 2015-09-13 ENCOUNTER — Telehealth: Payer: Self-pay | Admitting: Family

## 2015-09-13 ENCOUNTER — Other Ambulatory Visit (INDEPENDENT_AMBULATORY_CARE_PROVIDER_SITE_OTHER): Payer: PRIVATE HEALTH INSURANCE

## 2015-09-13 ENCOUNTER — Encounter: Payer: Self-pay | Admitting: Family

## 2015-09-13 VITALS — BP 150/78 | HR 72 | Temp 98.6°F | Resp 16 | Ht 69.5 in | Wt 194.8 lb

## 2015-09-13 DIAGNOSIS — Z0001 Encounter for general adult medical examination with abnormal findings: Secondary | ICD-10-CM

## 2015-09-13 DIAGNOSIS — R6889 Other general symptoms and signs: Secondary | ICD-10-CM | POA: Diagnosis not present

## 2015-09-13 DIAGNOSIS — L720 Epidermal cyst: Secondary | ICD-10-CM

## 2015-09-13 DIAGNOSIS — Z0000 Encounter for general adult medical examination without abnormal findings: Secondary | ICD-10-CM | POA: Insufficient documentation

## 2015-09-13 LAB — CBC
HEMATOCRIT: 39 % (ref 39.0–52.0)
Hemoglobin: 13.2 g/dL (ref 13.0–17.0)
MCHC: 33.8 g/dL (ref 30.0–36.0)
MCV: 97.4 fl (ref 78.0–100.0)
Platelets: 229 10*3/uL (ref 150.0–400.0)
RBC: 4 Mil/uL — ABNORMAL LOW (ref 4.22–5.81)
RDW: 13 % (ref 11.5–15.5)
WBC: 4.5 10*3/uL (ref 4.0–10.5)

## 2015-09-13 LAB — LIPID PANEL
CHOLESTEROL: 108 mg/dL (ref 0–200)
HDL: 44.7 mg/dL (ref >39.00)
LDL Cholesterol: 53 mg/dL (ref 0–99)
NonHDL: 63.6
TRIGLYCERIDES: 52 mg/dL (ref 0.0–149.0)
Total CHOL/HDL Ratio: 2
VLDL: 10.4 mg/dL (ref 0.0–40.0)

## 2015-09-13 LAB — COMPREHENSIVE METABOLIC PANEL
ALBUMIN: 4 g/dL (ref 3.5–5.2)
ALK PHOS: 66 U/L (ref 39–117)
ALT: 13 U/L (ref 0–53)
AST: 20 U/L (ref 0–37)
BUN: 12 mg/dL (ref 6–23)
CHLORIDE: 106 meq/L (ref 96–112)
CO2: 28 mEq/L (ref 19–32)
Calcium: 9.2 mg/dL (ref 8.4–10.5)
Creatinine, Ser: 1.1 mg/dL (ref 0.40–1.50)
GFR: 73.26 mL/min (ref >60.00)
Glucose, Bld: 89 mg/dL (ref 70–99)
POTASSIUM: 4.1 meq/L (ref 3.5–5.1)
Sodium: 140 mEq/L (ref 135–145)
TOTAL PROTEIN: 7.2 g/dL (ref 6.0–8.3)
Total Bilirubin: 0.7 mg/dL (ref 0.2–1.2)

## 2015-09-13 LAB — PSA: PSA: 0.28 ng/mL (ref 0.10–4.00)

## 2015-09-13 LAB — TSH: TSH: 0.94 u[IU]/mL (ref 0.35–4.50)

## 2015-09-13 NOTE — Progress Notes (Signed)
Pre visit review using our clinic review tool, if applicable. No additional management support is needed unless otherwise documented below in the visit note. 

## 2015-09-13 NOTE — Assessment & Plan Note (Signed)
1) Anticipatory Guidance: Discussed importance of wearing a seatbelt while driving and not texting while driving; changing batteries in smoke detector at least once annually; wearing suntan lotion when outside; eating a balanced and moderate diet; getting physical activity at least 30 minutes per day.  2) Immunizations / Screenings / Labs:  Declines tetanus. All other immunizations are up to date per recommendations. Due for a dental examination encouraged to be completed independently. Obtain PSA for prostate cancer screening. Due for colon cancer screening with colonoscopy and Cologuard discussed and patient will decide in the near future. All other screenings are up-to-date per recommendations. Obtain CBC, CMET, Lipid profile and TSH.   Overall well exam with risk factors for cardiovascular disease including elevated blood pressure, tobacco use and increased alcohol consumption. Blood pressure remains slightly elevated today with lifestyle management. We'll follow-up in one month to recheck. If it remains elevated we will start blood pressure medication. Discussed importance of tobacco cessation and risk for future cardiac and respiratory disease. He is not ready to quit smoking at this time. Also emphasize importance of decreased alcohol consumption which increases his risk for chronic diseases in the future as well as liver issues. Emphasize fruits and vegetables and nutritional intake. Continue other healthy lifestyle behaviors and choices. Follow-up prevention exam in 1 year. Follow-up office visit in one month for blood pressure check.

## 2015-09-13 NOTE — Telephone Encounter (Signed)
Your blood work shows that your kidney function, liver function, electrolytes, prostate function, cholesterol, and white/red blood cells are within the normal limits. Therefore he can plan to follow up in a year or sooner if needed.

## 2015-09-13 NOTE — Patient Instructions (Addendum)
Thank you for choosing [facility].  Summary/Instructions:  A referral to dermatology has been placed.   Health Maintenance, Male A healthy lifestyle and preventative care can promote health and wellness.  Maintain regular health, dental, and eye exams.  Eat a healthy diet. Foods like vegetables, fruits, whole grains, low-fat dairy products, and lean protein foods contain the nutrients you need and are low in calories. Decrease your intake of foods high in solid fats, added sugars, and salt. Get information about a proper diet from your health care provider, if necessary.  Regular physical exercise is one of the most important things you can do for your health. Most adults should get at least 150 minutes of moderate-intensity exercise (any activity that increases your heart rate and causes you to sweat) each week. In addition, most adults need muscle-strengthening exercises on 2 or more days a week.   Maintain a healthy weight. The body mass index (BMI) is a screening tool to identify possible weight problems. It provides an estimate of body fat based on height and weight. Your health care provider can find your BMI and can help you achieve or maintain a healthy weight. For males 20 years and older:  A BMI below 18.5 is considered underweight.  A BMI of 18.5 to 24.9 is normal.  A BMI of 25 to 29.9 is considered overweight.  A BMI of 30 and above is considered obese.  Maintain normal blood lipids and cholesterol by exercising and minimizing your intake of saturated fat. Eat a balanced diet with plenty of fruits and vegetables. Blood tests for lipids and cholesterol should begin at age 42 and be repeated every 5 years. If your lipid or cholesterol levels are high, you are over age 86, or you are at high risk for heart disease, you may need your cholesterol levels checked more frequently.Ongoing high lipid and cholesterol levels should be treated with medicines if diet and exercise are  not working.  If you smoke, find out from your health care provider how to quit. If you do not use tobacco, do not start.  Lung cancer screening is recommended for adults aged 55-80 years who are at high risk for developing lung cancer because of a history of smoking. A yearly low-dose CT scan of the lungs is recommended for people who have at least a 30-pack-year history of smoking and are current smokers or have quit within the past 15 years. A pack year of smoking is smoking an average of 1 pack of cigarettes a day for 1 year (for example, a 30-pack-year history of smoking could mean smoking 1 pack a day for 30 years or 2 packs a day for 15 years). Yearly screening should continue until the smoker has stopped smoking for at least 15 years. Yearly screening should be stopped for people who develop a health problem that would prevent them from having lung cancer treatment.  If you choose to drink alcohol, do not have more than 2 drinks per day. One drink is considered to be 12 oz (360 mL) of beer, 5 oz (150 mL) of wine, or 1.5 oz (45 mL) of liquor.  Avoid the use of street drugs. Do not share needles with anyone. Ask for help if you need support or instructions about stopping the use of drugs.  High blood pressure causes heart disease and increases the risk of stroke. High blood pressure is more likely to develop in:  People who have blood pressure in the end of the normal  range (100-139/85-89 mm Hg).  People who are overweight or obese.  People who are African American.  If you are 82-40 years of age, have your blood pressure checked every 3-5 years. If you are 57 years of age or older, have your blood pressure checked every year. You should have your blood pressure measured twice--once when you are at a hospital or clinic, and once when you are not at a hospital or clinic. Record the average of the two measurements. To check your blood pressure when you are not at a hospital or clinic, you can  use:  An automated blood pressure machine at a pharmacy.  A home blood pressure monitor.  If you are 55-66 years old, ask your health care provider if you should take aspirin to prevent heart disease.  Diabetes screening involves taking a blood sample to check your fasting blood sugar level. This should be done once every 3 years after age 14 if you are at a normal weight and without risk factors for diabetes. Testing should be considered at a younger age or be carried out more frequently if you are overweight and have at least 1 risk factor for diabetes.  Colorectal cancer can be detected and often prevented. Most routine colorectal cancer screening begins at the age of 47 and continues through age 15. However, your health care provider may recommend screening at an earlier age if you have risk factors for colon cancer. On a yearly basis, your health care provider may provide home test kits to check for hidden blood in the stool. A small camera at the end of a tube may be used to directly examine the colon (sigmoidoscopy or colonoscopy) to detect the earliest forms of colorectal cancer. Talk to your health care provider about this at age 51 when routine screening begins. A direct exam of the colon should be repeated every 5-10 years through age 56, unless early forms of precancerous polyps or small growths are found.  People who are at an increased risk for hepatitis B should be screened for this virus. You are considered at high risk for hepatitis B if:  You were born in a country where hepatitis B occurs often. Talk with your health care provider about which countries are considered high risk.  Your parents were born in a high-risk country and you have not received a shot to protect against hepatitis B (hepatitis B vaccine).  You have HIV or AIDS.  You use needles to inject street drugs.  You live with, or have sex with, someone who has hepatitis B.  You are a man who has sex with other  men (MSM).  You get hemodialysis treatment.  You take certain medicines for conditions like cancer, organ transplantation, and autoimmune conditions.  Hepatitis C blood testing is recommended for all people born from [birth year] through 1967 and any individual with known risk factors for hepatitis C.  Healthy men should no longer receive prostate-specific antigen (PSA) blood tests as part of routine cancer screening. Talk to your health care provider about prostate cancer screening.  Testicular cancer screening is not recommended for adolescents or adult males who have no symptoms. Screening includes self-exam, a health care provider exam, and other screening tests. Consult with your health care provider about any symptoms you have or any concerns you have about testicular cancer.  Practice safe sex. Use condoms and avoid high-risk sexual practices to reduce the spread of sexually transmitted infections (STIs).  You should be screened for STIs,  including gonorrhea and chlamydia if:  You are sexually active and are younger than 24 years.  You are older than 24 years, and your health care provider tells you that you are at risk for this type of infection.  Your sexual activity has changed since you were last screened, and you are at an increased risk for chlamydia or gonorrhea. Ask your health care provider if you are at risk.  If you are at risk of being infected with HIV, it is recommended that you take a prescription medicine daily to prevent HIV infection. This is called pre-exposure prophylaxis (PrEP). You are considered at risk if:  You are a man who has sex with other men (MSM).  You are a heterosexual man who is sexually active with multiple partners.  You take drugs by injection.  You are sexually active with a partner who has HIV.  Talk with your health care provider about whether you are at high risk of being infected with HIV. If you choose to begin PrEP, you should first be tested  for HIV. You should then be tested every 3 months for as long as you are taking PrEP.  Use sunscreen. Apply sunscreen liberally and repeatedly throughout the day. You should seek shade when your shadow is shorter than you. Protect yourself by wearing long sleeves, pants, a wide-brimmed hat, and sunglasses year round whenever you are outdoors.  Tell your health care provider of new moles or changes in moles, especially if there is a change in shape or color. Also, tell your health care provider if a mole is larger than the size of a pencil eraser.  A one-time screening for abdominal aortic aneurysm (AAA) and surgical repair of large AAAs by ultrasound is recommended for men aged 65-75 years who are current or former smokers.  Stay current with your vaccines (immunizations).   This information is not intended to replace advice given to you by your health care provider. Make sure you discuss any questions you have with your health care provider.   Document Released: 11/02/2007 Document Revised: 05/27/2014 Document Reviewed: 10/01/2010 Elsevier Interactive Patient Education 2016 [employer].   Smoking Hazards Smoking cigarettes is extremely bad for your health. Tobacco smoke has over 200 known poisons in it. It contains the poisonous gases nitrogen oxide and carbon monoxide. There are over 60 chemicals in tobacco smoke that cause cancer. Some of the chemicals found in cigarette smoke include:   Cyanide.   Benzene.   Formaldehyde.   Methanol (wood alcohol).   Acetylene (fuel used in welding torches).   Ammonia.  Even smoking lightly shortens your life expectancy by several years. You can greatly reduce the risk of medical problems for you and your family by stopping now. Smoking is the most preventable cause of death and disease in our society. Within days of quitting smoking, your circulation improves, you decrease the risk of having a heart attack, and your lung capacity improves.  There may be some increased phlegm in the first few days after quitting, and it may take months for your lungs to clear up completely. Quitting for 10 years reduces your risk of developing lung cancer to almost that of a nonsmoker.  WHAT ARE THE RISKS OF SMOKING? Cigarette smokers have an increased risk of many serious medical problems, including:  Lung cancer.   Lung disease (such as pneumonia, bronchitis, and emphysema).   Heart attack and chest pain due to the heart not getting enough oxygen (angina).   Heart  disease and peripheral blood vessel disease.   Hypertension.   Stroke.   Oral cancer (cancer of the lip, mouth, or voice box).   Bladder cancer.   Pancreatic cancer.   Cervical cancer.   Pregnancy complications, including premature birth.   Stillbirths and smaller newborn babies, birth defects, and genetic damage to sperm.   Early menopause.   Lower estrogen level for women.   Infertility.   Facial wrinkles.   Blindness.   Increased risk of broken bones (fractures).   Senile dementia.   Stomach ulcers and internal bleeding.   Delayed wound healing and increased risk of complications during surgery. Because of secondhand smoke exposure, children of smokers have an increased risk of the following:   Sudden infant death syndrome (SIDS).   Respiratory infections.   Lung cancer.   Heart disease.   Ear infections.  WHY IS SMOKING ADDICTIVE? Nicotine is the chemical agent in tobacco that is capable of causing addiction or dependence. When you smoke and inhale, nicotine is absorbed rapidly into the bloodstream through your lungs. Both inhaled and noninhaled nicotine may be addictive.  WHAT ARE THE BENEFITS OF QUITTING?  There are many health benefits to quitting smoking. Some are:   The likelihood of developing cancer and heart disease decreases. Health improvements are seen almost immediately.   Blood pressure, pulse rate, and  breathing patterns start returning to normal soon after quitting.   People who quit may see an improvement in their overall quality of life.  HOW DO YOU QUIT SMOKING? Smoking is an addiction with both physical and psychological effects, and longtime habits can be hard to change. Your health care provider can recommend:  Programs and community resources, which may include group support, education, or therapy.  Replacement products, such as patches, gum, and nasal sprays. Use these products only as directed. Do not replace cigarette smoking with electronic cigarettes (commonly called e-cigarettes). The safety of e-cigarettes is unknown, and some may contain harmful chemicals. FOR MORE INFORMATION  American Lung Association: www.lung.org  American Cancer Society: www.cancer.org   This information is not intended to replace advice given to you by your health care provider. Make sure you discuss any questions you have with your health care provider.   Document Released: 06/13/2004 Document Revised: 02/24/2013 Document Reviewed: 10/26/2012 Elsevier Interactive Patient Education 2016 [employer]  Steps to Quit Smoking  Smoking tobacco can be harmful to your health and can affect almost every organ in your body. Smoking puts you, and those around you, at risk for developing many serious chronic diseases. Quitting smoking is difficult, but it is one of the best things that you can do for your health. It is never too late to quit. WHAT ARE THE BENEFITS OF QUITTING SMOKING? When you quit smoking, you lower your risk of developing serious diseases and conditions, such as:  Lung cancer or lung disease, such as COPD.  Heart disease.  Stroke.  Heart attack.  Infertility.  Osteoporosis and bone fractures. Additionally, symptoms such as coughing, wheezing, and shortness of breath may get better when you quit. You may also find that you get sick less often because your body is stronger at  fighting off colds and infections. If you are pregnant, quitting smoking can help to reduce your chances of having a baby of low birth weight. HOW DO I GET READY TO QUIT? When you decide to quit smoking, create a plan to make sure that you are successful. Before you quit:  Pick a date  to quit. Set a date within the next two weeks to give you time to prepare.  Write down the reasons why you are quitting. Keep this list in places where you will see it often, such as on your bathroom mirror or in your car or wallet.  Identify the people, places, things, and activities that make you want to smoke (triggers) and avoid them. Make sure to take these actions:  Throw away all cigarettes at home, at work, and in your car.  Throw away smoking accessories, such as Set designer.  Clean your car and make sure to empty the ashtray.  Clean your home, including curtains and carpets.  Tell your family, friends, and coworkers that you are quitting. Support from your loved ones can make quitting easier.  Talk with your health care provider about your options for quitting smoking.  Find out what treatment options are covered by your health insurance. WHAT STRATEGIES CAN I USE TO QUIT SMOKING?  Talk with your healthcare provider about different strategies to quit smoking. Some strategies include:  Quitting smoking altogether instead of gradually lessening how much you smoke over a period of time. Research shows that quitting "cold [place]" is more successful than gradually quitting.  Attending in-person counseling to help you build problem-solving skills. You are more likely to have success in quitting if you attend several counseling sessions. Even short sessions of 10 minutes can be effective.  Finding resources and support systems that can help you to quit smoking and remain smoke-free after you quit. These resources are most helpful when you use them often. They can include:  Online chats with  a Veterinary surgeon.  Telephone quitlines.  Printed Materials engineer.  Support groups or group counseling.  Text messaging programs.  Mobile phone applications.  Taking medicines to help you quit smoking. (If you are pregnant or breastfeeding, talk with your health care provider first.) Some medicines contain nicotine and some do not. Both types of medicines help with cravings, but the medicines that include nicotine help to relieve withdrawal symptoms. Your health care provider may recommend:  Nicotine patches, gum, or lozenges.  Nicotine inhalers or sprays.  Non-nicotine medicine that is taken by mouth. Talk with your health care provider about combining strategies, such as taking medicines while you are also receiving in-person counseling. Using these two strategies together makes you more likely to succeed in quitting than if you used either strategy on its own. If you are pregnant or breastfeeding, talk with your health care provider about finding counseling or other support strategies to quit smoking. Do not take medicine to help you quit smoking unless told to do so by your health care provider. WHAT THINGS CAN I DO TO MAKE IT EASIER TO QUIT? Quitting smoking might feel overwhelming at first, but there is a lot that you can do to make it easier. Take these important actions:  Reach out to your family and friends and ask that they support and encourage you during this time. Call telephone quitlines, reach out to support groups, or work with a counselor for support.  Ask people who smoke to avoid smoking around you.  Avoid places that trigger you to smoke, such as bars, parties, or smoke-break areas at work.  Spend time around people who do not smoke.  Lessen stress in your life, because stress can be a smoking trigger for some people. To lessen stress, try:  Exercising regularly.  Deep-breathing exercises.  Yoga.  Meditating.  Performing a body scan.  This involves closing  your eyes, scanning your body from head to toe, and noticing which parts of your body are particularly tense. Purposefully relax the muscles in those areas.  Download or purchase mobile phone or tablet apps (applications) that can help you stick to your quit plan by providing reminders, tips, and encouragement. There are many free apps, such as QuitGuide from the [employer] developer for Disease Control and Prevention). You can find other support for quitting smoking (smoking cessation) through smokefree.gov and other websites. HOW WILL I FEEL WHEN I QUIT SMOKING? Within the first 24 hours of quitting smoking, you may start to feel some withdrawal symptoms. These symptoms are usually most noticeable 2-3 days after quitting, but they usually do not last beyond 2-3 weeks. Changes or symptoms that you might experience include:  Mood swings.  Restlessness, anxiety, or irritation.  Difficulty concentrating.  Dizziness.  Strong cravings for sugary foods in addition to nicotine.  Mild weight gain.  Constipation.  Nausea.  Coughing or a sore throat.  Changes in how your medicines work in your body.  A depressed mood.  Difficulty sleeping (insomnia). After the first 2-3 weeks of quitting, you may start to notice more positive results, such as:  Improved sense of smell and taste.  Decreased coughing and sore throat.  Slower heart rate.  Lower blood pressure.  Clearer skin.  The ability to breathe more easily.  Fewer sick days. Quitting smoking is very challenging for most people. Do not get discouraged if you are not successful the first time. Some people need to make many attempts to quit before they achieve long-term success. Do your best to stick to your quit plan, and talk with your health care provider if you have any questions or concerns.   This information is not intended to replace advice given to you by your health care provider. Make sure you discuss any questions you have with  your health care provider.   Document Released: 04/30/2001 Document Revised: 09/20/2014 Document Reviewed: 09/20/2014 Elsevier Interactive Patient Education 2016 [employer].  Smoking Cessation, Tips for Success If you are ready to quit smoking, congratulations! You have chosen to help yourself be healthier. Cigarettes bring nicotine, tar, carbon monoxide, and other irritants into your body. Your lungs, heart, and blood vessels will be able to work better without these poisons. There are many different ways to quit smoking. Nicotine gum, nicotine patches, a nicotine inhaler, or nicotine nasal spray can help with physical craving. Hypnosis, support groups, and medicines help break the habit of smoking. WHAT THINGS CAN I DO TO MAKE QUITTING EASIER?  Here are some tips to help you quit for good:  Pick a date when you will quit smoking completely. Tell all of your friends and family about your plan to quit on that date.  Do not try to slowly cut down on the number of cigarettes you are smoking. Pick a quit date and quit smoking completely starting on that day.  Throw away all cigarettes.   Clean and remove all ashtrays from your home, work, and car.  On a card, write down your reasons for quitting. Carry the card with you and read it when you get the urge to smoke.  Cleanse your body of nicotine. Drink enough water and fluids to keep your urine clear or pale yellow. Do this after quitting to flush the nicotine from your body.  Learn to predict your moods. Do not let a bad situation be your excuse to have a  cigarette. Some situations in your life might tempt you into wanting a cigarette.  Never have "just one" cigarette. It leads to wanting another and another. Remind yourself of your decision to quit.  Change habits associated with smoking. If you smoked while driving or when feeling stressed, try other activities to replace smoking. Stand up when drinking your coffee. Brush your teeth after  eating. Sit in a different chair when you read the paper. Avoid alcohol while trying to quit, and try to drink fewer caffeinated beverages. Alcohol and caffeine may urge you to smoke.  Avoid foods and drinks that can trigger a desire to smoke, such as sugary or spicy foods and alcohol.  Ask people who smoke not to smoke around you.  Have something planned to do right after eating or having a cup of coffee. For example, plan to take a walk or exercise.  Try a relaxation exercise to calm you down and decrease your stress. Remember, you may be tense and nervous for the first 2 weeks after you quit, but this will pass.  Find new activities to keep your hands busy. Play with a pen, coin, or rubber band. Doodle or draw things on paper.  Brush your teeth right after eating. This will help cut down on the craving for the taste of tobacco after meals. You can also try mouthwash.   Use oral substitutes in place of cigarettes. Try using lemon drops, carrots, cinnamon sticks, or chewing gum. Keep them handy so they are available when you have the urge to smoke.  When you have the urge to smoke, try deep breathing.  Designate your home as a nonsmoking area.  If you are a heavy smoker, ask your health care provider about a prescription for nicotine chewing gum. It can ease your withdrawal from nicotine.  Reward yourself. Set aside the cigarette money you save and buy yourself something nice.  Look for support from others. Join a support group or smoking cessation program. Ask someone at home or at work to help you with your plan to quit smoking.  Always ask yourself, "Do I need this cigarette or is this just a reflex?" Tell yourself, "Today, I choose not to smoke," or "I do not want to smoke." You are reminding yourself of your decision to quit.  Do not replace cigarette smoking with electronic cigarettes (commonly called e-cigarettes). The safety of e-cigarettes is unknown, and some may contain  harmful chemicals.  If you relapse, do not give up! Plan ahead and think about what you will do the next time you get the urge to smoke. HOW WILL I FEEL WHEN I QUIT SMOKING? You may have symptoms of withdrawal because your body is used to nicotine (the addictive substance in cigarettes). You may crave cigarettes, be irritable, feel very hungry, cough often, get headaches, or have difficulty concentrating. The withdrawal symptoms are only temporary. They are strongest when you first quit but will go away within 10-14 days. When withdrawal symptoms occur, stay in control. Think about your reasons for quitting. Remind yourself that these are signs that your body is healing and getting used to being without cigarettes. Remember that withdrawal symptoms are easier to treat than the major diseases that smoking can cause.  Even after the withdrawal is over, expect periodic urges to smoke. However, these cravings are generally short lived and will go away whether you smoke or not. Do not smoke! WHAT RESOURCES ARE AVAILABLE TO HELP ME QUIT SMOKING? Your health care provider  provider can direct you to community resources or hospitals for support, which may include:  Group support.  Education.  Hypnosis.  Therapy.   This information is not intended to replace advice given to you by your health care provider. Make sure you discuss any questions you have with your health care provider.   Document Released: 02/02/2004 Document Revised: 05/27/2014 Document Reviewed: 10/22/2012 Elsevier Interactive Patient Education 2016 Elsevier Inc.  

## 2015-09-13 NOTE — Progress Notes (Signed)
Subjective:    Patient ID: Kyle Morse, male    DOB: Jul 29, 1960, 55 y.o.   MRN: [id number]  Chief Complaint  Patient presents with  . Establish Care    CPE, Fasting, cyst on left eye that he would like removed x15 years     HPI:  Kyle Morse is a 55 y.o. male who presents today for an annual wellness visit.   1) Health Maintenance -   Diet - Averages about 3-4 meals per day consisting of eggs, bacon, cheese, steak, occasional fruits and vegetables  Exercise - Work is strenuous    2) Corporate investment banker / Immunizations:  Dental -- Due for exam  Vision -- Up to date    Health Maintenance  Topic Date Due  . Hepatitis C Screening  [birth year]  . HIV Screening  06/04/1973  . COLONOSCOPY  06/04/2008  . TETANUS/TDAP  02/02/2016 (Originally 06/04/1977)  . INFLUENZA VACCINE  12/19/2015     There is no immunization history on file for this patient.  No Known Allergies   Outpatient Prescriptions Prior to Visit  Medication Sig Dispense Refill  . hydroxychloroquine (PLAQUENIL) 200 MG tablet TAKE 1 TABLET (200 MG TOTAL) BY MOUTH 2 (TWO) TIMES DAILY. 60 tablet 11  . Multiple Vitamin (MULTIVITAMIN) tablet Take 1 tablet by mouth daily.      . tadalafil (CIALIS) 20 MG tablet Take 1 tablet (20 mg total) by mouth daily as needed. 10 tablet 2   No facility-administered medications prior to visit.     Past Medical History  Diagnosis Date  . ERECTILE DYSFUNCTION, ORGANIC   . ELEVATED BLOOD PRESSURE   . FOLLICULITIS   . Lupus erythematosus      Past Surgical History  Procedure Laterality Date  . No past surgeries       Family History  Problem Relation Age of Onset  . Peptic Ulcer Disease Mother      Social History   Social History  . Marital Status: Divorced    Spouse Name: N/A  . Number of Children: 0  . Years of Education: 12   Occupational History  . Presenter, broadcasting    Social History Main Topics  . Smoking status: Current Every Day Smoker --  1.00 packs/day    Types: Cigars  . Smokeless tobacco: Never Used     Comment: separated from wife since 2003. lives with mom & step dad  . Alcohol Use: 16.8 oz/week    14 Cans of beer, 14 Shots of liquor per week     Comment: Social  . Drug Use: No  . Sexual Activity: Not on file   Other Topics Concern  . Not on file   Social History Narrative     Review of Systems  Constitutional: Denies fever, chills, fatigue, or significant weight gain/loss. HENT: Head: Denies headache or neck pain Ears: Denies changes in hearing, ringing in ears, earache, drainage Nose: Denies discharge, stuffiness, itching, nosebleed, sinus pain Throat: Denies sore throat, hoarseness, dry mouth, sores, thrush Eyes: Denies loss/changes in vision, pain, redness, blurry/double vision, flashing lights Cardiovascular: Denies chest pain/discomfort, tightness, palpitations, shortness of breath with activity, difficulty lying down, swelling, sudden awakening with shortness of breath Respiratory: Denies shortness of breath, cough, sputum production, wheezing Gastrointestinal: Denies dysphasia, heartburn, change in appetite, nausea, change in bowel habits, rectal bleeding, constipation, diarrhea, yellow skin or eyes Genitourinary: Denies frequency, urgency, burning/pain, blood in urine, incontinence, change in urinary strength. Musculoskeletal: Denies muscle/joint pain, stiffness, back pain, redness  or swelling of joints, trauma Skin: Denies rashes, lumps, itching, dryness, color changes, or hair/nail changes Neurological: Denies dizziness, fainting, seizures, weakness, numbness, tingling, tremor Psychiatric - Denies nervousness, stress, depression or memory loss Endocrine: Denies heat or cold intolerance, sweating, frequent urination, excessive thirst, changes in appetite Hematologic: Denies ease of bruising or bleeding     Objective:     BP 150/78 mmHg  Pulse 72  Temp(Src) 98.6 F (37 C) (Oral)  Resp 16  Ht  5' 9.5" (1.765 m)  Wt 194 lb 12.8 oz (88.361 kg)  BMI 28.36 kg/m2  SpO2 97% Nursing note and vital signs reviewed.  Physical Exam  Constitutional: He is oriented to person, place, and time. He appears well-developed and well-nourished.  HENT:  Head: Normocephalic.  Right Ear: Hearing, tympanic membrane, external ear and ear canal normal.  Left Ear: Hearing, tympanic membrane, external ear and ear canal normal.  Nose: Nose normal.  Mouth/Throat: Uvula is midline, oropharynx is clear and moist and mucous membranes are normal.  Epidermal cyst located next to left eye  Eyes: Conjunctivae and EOM are normal. Pupils are equal, round, and reactive to light.  Neck: Neck supple. No JVD present. No tracheal deviation present. No thyromegaly present.  Cardiovascular: Normal rate, regular rhythm, normal heart sounds and intact distal pulses.   Pulmonary/Chest: Effort normal and breath sounds normal.  Abdominal: Soft. Bowel sounds are normal. He exhibits no distension and no mass. There is no tenderness. There is no rebound and no guarding.  Musculoskeletal: Normal range of motion. He exhibits no edema or tenderness.  Lymphadenopathy:    He has no cervical adenopathy.  Neurological: He is alert and oriented to person, place, and time. He has normal reflexes. No cranial nerve deficit. He exhibits normal muscle tone. Coordination normal.  Skin: Skin is warm and dry.  Psychiatric: He has a normal mood and affect. His behavior is normal. Judgment and thought content normal.       Assessment & Plan:   Problem List Items Addressed This Visit      Other   Routine adult health maintenance - Primary   Relevant Orders   CBC (Completed)   Comprehensive metabolic panel (Completed)   TSH (Completed)   PSA (Completed)   Lipid panel (Completed)   Epidermal cyst of face    Epidermal cyst noted next to the left eye. Refer to dermatology for possible drainage and excision.      Relevant Orders    Ambulatory referral to Dermatology

## 2015-09-13 NOTE — Assessment & Plan Note (Signed)
Epidermal cyst noted next to the left eye. Refer to dermatology for possible drainage and excision.

## 2015-09-15 NOTE — Telephone Encounter (Signed)
LVM letting pt know.  

## 2015-10-17 IMAGING — CR DG SHOULDER 2+V*R*
4 series · 4 of 4 positions shown · non-contrast
Comparison: None.

CLINICAL DATA: Pain, status post injury

EXAM:
RIGHT SHOULDER - 2+ VIEW

[view not recorded (1 of 4)]
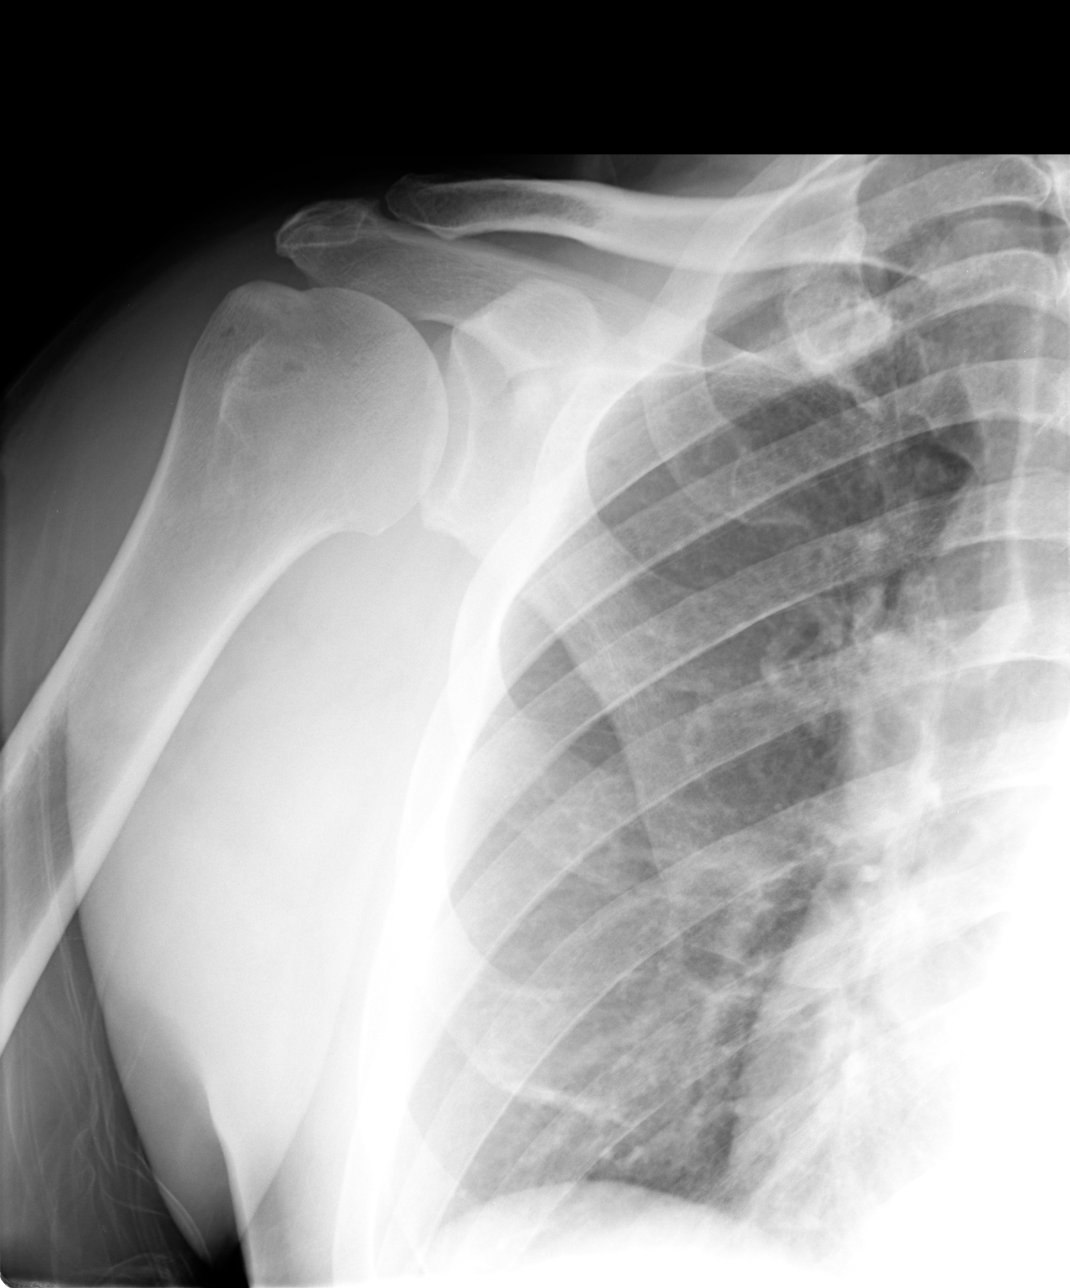

[view not recorded (2 of 4)]
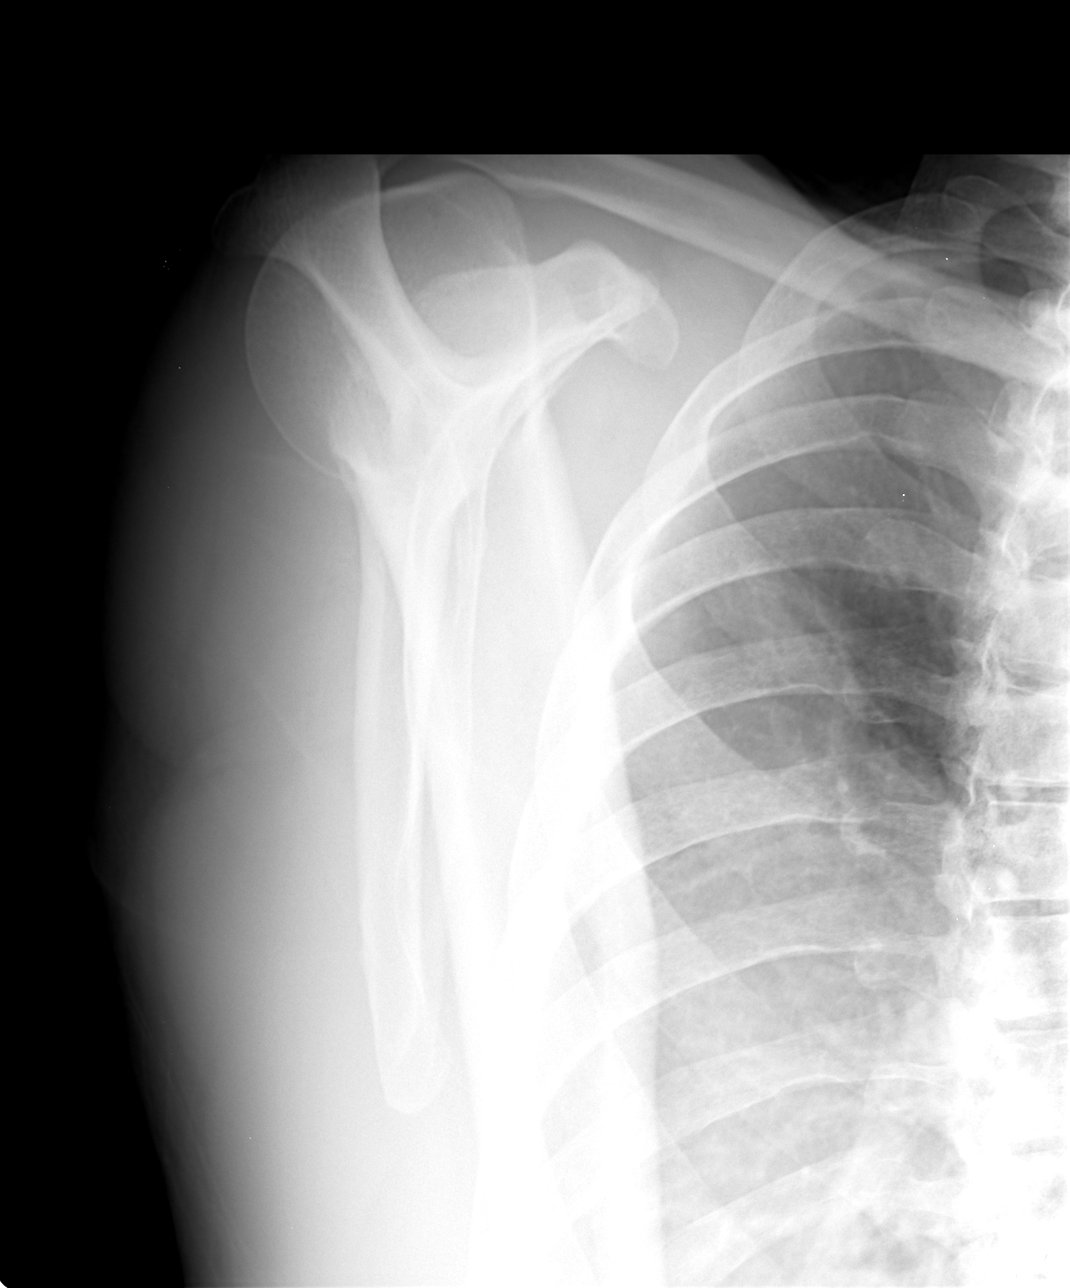

[view not recorded (3 of 4)]
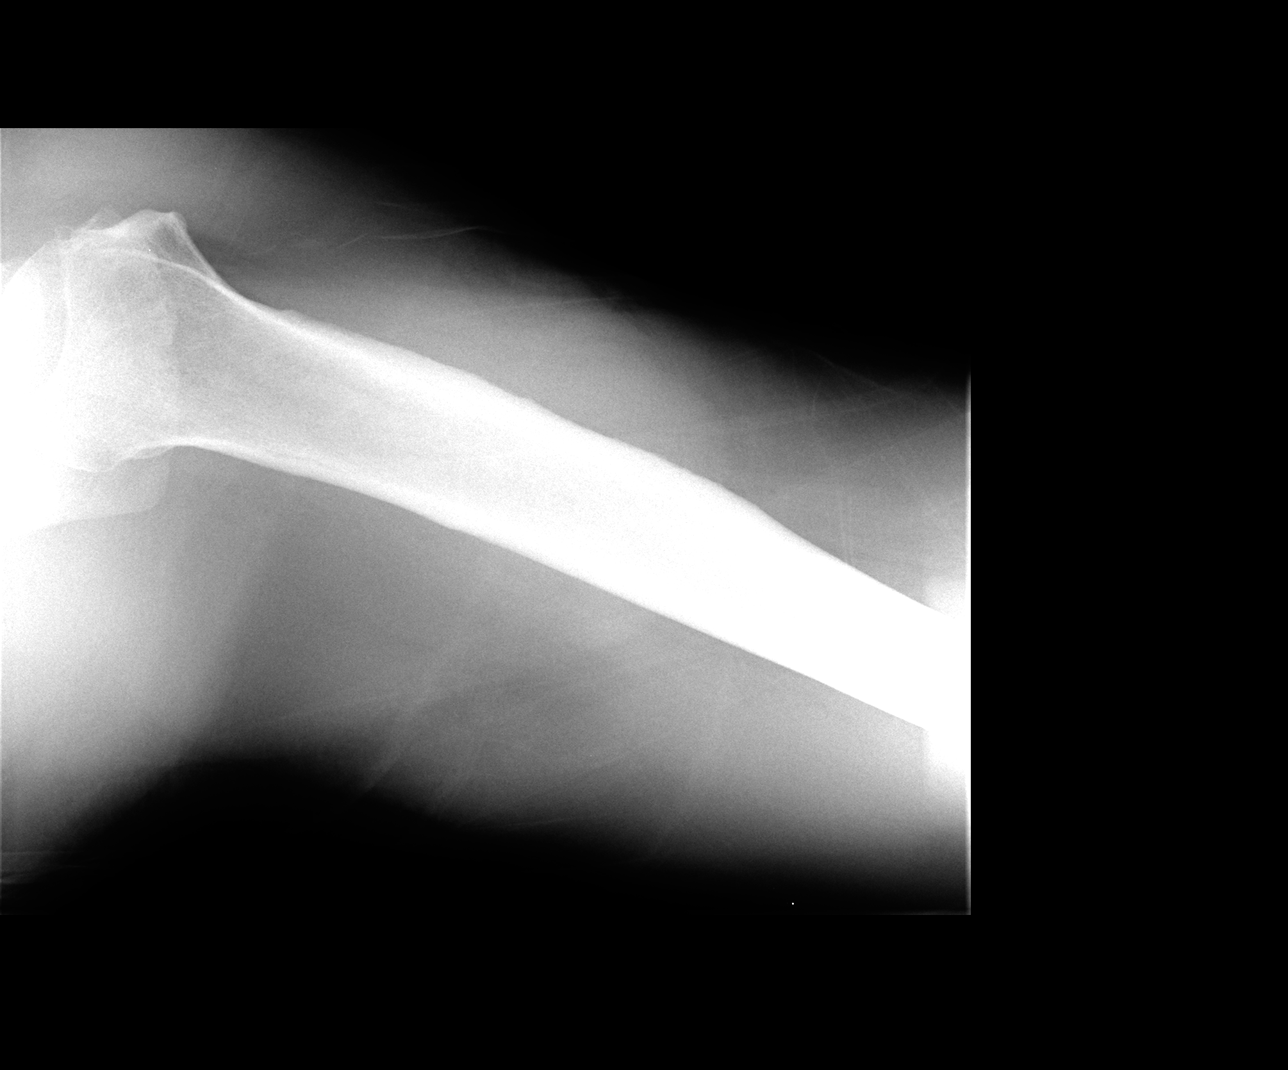

[view not recorded (4 of 4)]
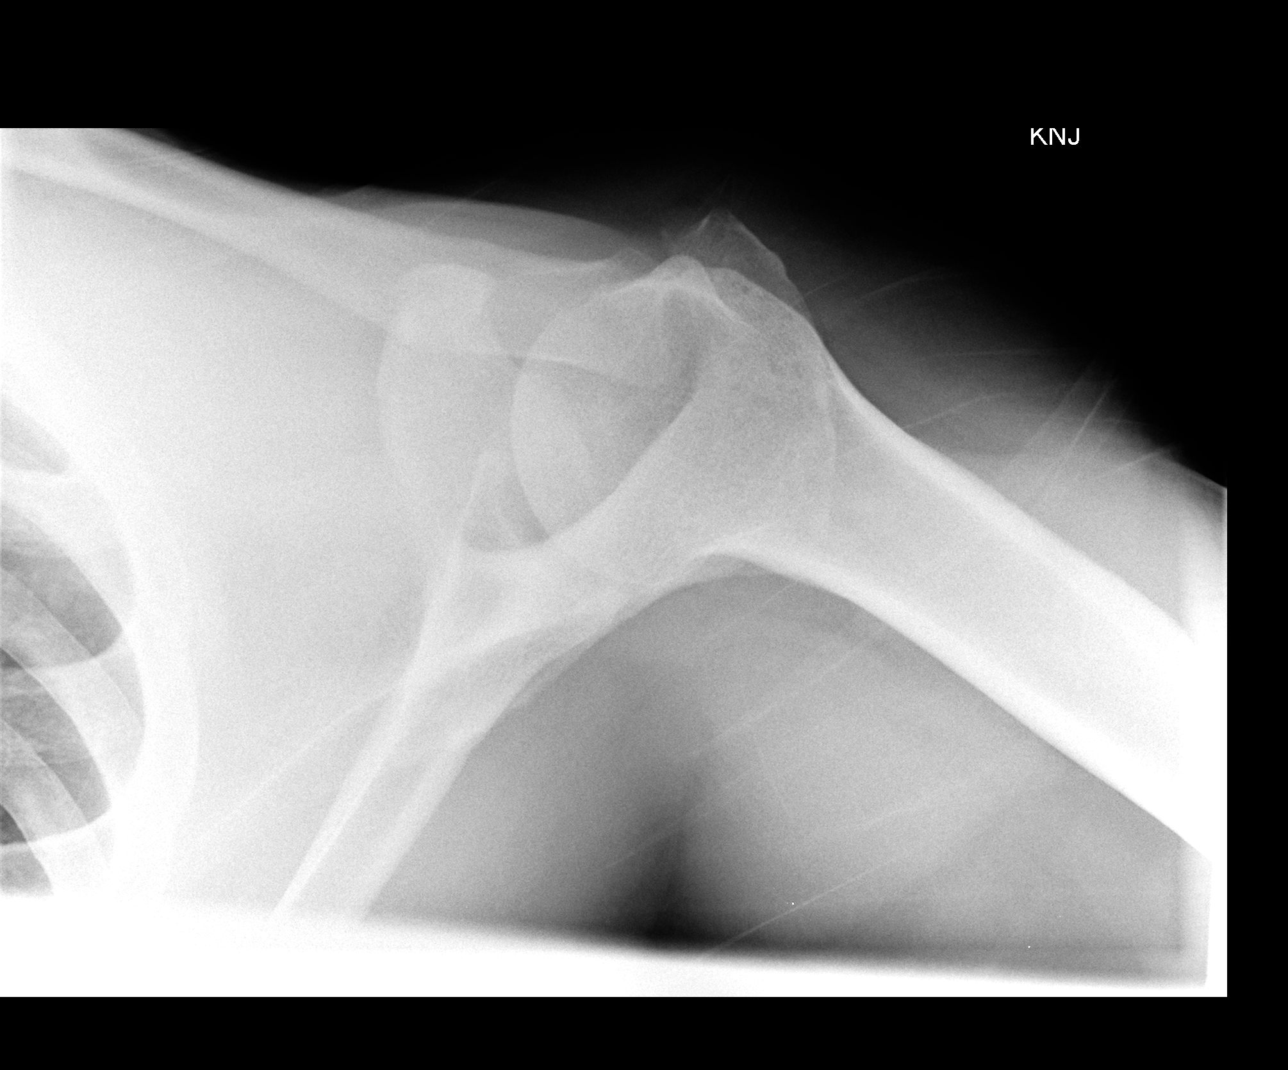

[4 of 4 positions shown; findings below may reference images not displayed]

FINDINGS: There is no evidence of fracture or dislocation. There is no
evidence of arthropathy or other focal bone abnormality. Soft
tissues are unremarkable.
IMPRESSION: Negative.

## 2015-10-31 ENCOUNTER — Other Ambulatory Visit: Payer: Self-pay | Admitting: Family

## 2016-01-18 ENCOUNTER — Encounter: Payer: Self-pay | Admitting: Student

## 2016-12-17 ENCOUNTER — Other Ambulatory Visit: Payer: Self-pay | Admitting: Family

## 2017-02-08 ENCOUNTER — Other Ambulatory Visit: Payer: Self-pay | Admitting: Family

## 2017-02-10 NOTE — Telephone Encounter (Signed)
Patient needs to schedule appt first/has not been seen since 4.2017/thx dmf

## 2017-04-02 ENCOUNTER — Ambulatory Visit: Payer: PRIVATE HEALTH INSURANCE | Admitting: Family Medicine

## 2017-04-02 ENCOUNTER — Encounter: Payer: Self-pay | Admitting: Family Medicine

## 2017-04-02 DIAGNOSIS — L930 Discoid lupus erythematosus: Secondary | ICD-10-CM

## 2017-04-02 MED ORDER — HYDROXYCHLOROQUINE SULFATE 200 MG PO TABS: ORAL_TABLET | ORAL | 0 refills | Status: DC

## 2017-04-02 NOTE — Assessment & Plan Note (Signed)
Reports starting Plaquenil being diagnosed with this several years ago. Unclear if he has followed up with dermatology. Denies any eye exam well on the Plaquenil. Lab work appears to be normal. - Refilled Plaquenil. - He has a yearly exam scheduled in January. At that time yearly blood work will be obtained. Could consider referral to ophthalmology. Could consider referral back to dermatology.

## 2017-04-02 NOTE — Progress Notes (Signed)
Kyle Morse - 56 y.o. male MRN [id number]  Date of birth: [birth year]  SUBJECTIVE:  Including CC & ROS.  Chief Complaint  Patient presents with  . Medication Refill    Plaquenil     [name] is a 56 y.o. male that is presenting for med refills for plaquenil. He was started on this medication by his dermatologist several years ago. He reports his symptoms are well controlled with this medication. He denies any problems or interactions with it. He has a history of lupus erythematous.   Review his blood work from 2017 showed to be normal.   Review of Systems  Constitutional: Negative for fever.  Musculoskeletal: Negative for gait problem.  Skin: Positive for rash.  Neurological: Negative for weakness and numbness.    HISTORY: Past Medical, Surgical, Social, and Family History Reviewed & Updated per EMR.   Pertinent Historical Findings include:  Past Medical History:  Diagnosis Date  . ELEVATED BLOOD PRESSURE   . ERECTILE DYSFUNCTION, ORGANIC   . FOLLICULITIS   . Lupus erythematosus     Past Surgical History:  Procedure Laterality Date  . NO PAST SURGERIES      No Known Allergies  Family History  Problem Relation Age of Onset  . Peptic Ulcer Disease Mother      Social History   Socioeconomic History  . Marital status: Divorced    Spouse name: Not on file  . Number of children: 0  . Years of education: 7512  . Highest education level: Not on file  Social Needs  . Financial resource strain: Not on file  . Food insecurity - worry: Not on file  . Food insecurity - inability: Not on file  . Transportation needs - medical: Not on file  . Transportation needs - non-medical: Not on file  Occupational History  . 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 Smoking status: Current Every Day Smoker    Packs/day: 1.00    Types: Cigars  . Smokeless tobacco: Never Used  . Tobacco comment: separated from wife since 2003. lives with mom & step dad  Substance and  Sexual Activity  . Alcohol use: Yes    Alcohol/week: 16.8 oz    Types: 14 Cans of beer, 14 Shots of liquor per week    Comment: Social  . Drug use: No  . Sexual activity: Not on file  Other Topics Concern  . Not on file  Social History Narrative  . Not on file     PHYSICAL EXAM:  VS: BP 138/82 (BP Location: Left Arm, Patient Position: Sitting, Cuff Size: Normal)   Pulse 76   Temp 99.2 F (37.3 C) (Oral)   Ht 5' 9.5" (1.765 m)   Wt 180 lb (81.6 kg)   SpO2 99%   BMI 26.20 kg/m  Physical Exam Gen: NAD, alert, cooperative with exam, well-appearing ENT: normal lips, normal nasal mucosa,  Eye: normal EOM, normal conjunctiva and lids CV:  no edema, +2 pedal pulses   Resp: no accessory muscle use, non-labored,  Skin: Macular rashes on the front of the scalp as well as the occipital region of his scalp., no areas of induration  Neuro: normal tone, normal sensation to touch Psych:  normal insight, alert and oriented MSK: Normal gait, normal strength      ASSESSMENT & PLAN:   Lupus erythematosus Reports starting Plaquenil being diagnosed with this several years ago. Unclear if he has followed up with dermatology. Denies any eye exam well on  the Plaquenil. Lab work appears to be normal. - Refilled Plaquenil. - He has a yearly exam scheduled in January. At that time yearly blood work will be obtained. Could consider referral to ophthalmology. Could consider referral back to dermatology.

## 2017-06-10 ENCOUNTER — Ambulatory Visit (INDEPENDENT_AMBULATORY_CARE_PROVIDER_SITE_OTHER): Payer: PRIVATE HEALTH INSURANCE | Admitting: Internal Medicine

## 2017-06-10 ENCOUNTER — Encounter: Payer: Self-pay | Admitting: Internal Medicine

## 2017-06-10 ENCOUNTER — Other Ambulatory Visit (INDEPENDENT_AMBULATORY_CARE_PROVIDER_SITE_OTHER): Payer: PRIVATE HEALTH INSURANCE

## 2017-06-10 VITALS — BP 142/90 | HR 75 | Temp 98.4°F | Ht 69.5 in | Wt 181.0 lb

## 2017-06-10 DIAGNOSIS — Z114 Encounter for screening for human immunodeficiency virus [HIV]: Secondary | ICD-10-CM | POA: Diagnosis not present

## 2017-06-10 DIAGNOSIS — Z1159 Encounter for screening for other viral diseases: Secondary | ICD-10-CM | POA: Diagnosis not present

## 2017-06-10 DIAGNOSIS — Z0000 Encounter for general adult medical examination without abnormal findings: Secondary | ICD-10-CM

## 2017-06-10 DIAGNOSIS — L930 Discoid lupus erythematosus: Secondary | ICD-10-CM | POA: Diagnosis not present

## 2017-06-10 LAB — URINALYSIS, ROUTINE W REFLEX MICROSCOPIC
BILIRUBIN URINE: NEGATIVE
Hgb urine dipstick: NEGATIVE
Ketones, ur: NEGATIVE
Nitrite: NEGATIVE
Specific Gravity, Urine: 1.02 (ref 1.000–1.030)
TOTAL PROTEIN, URINE-UPE24: NEGATIVE
URINE GLUCOSE: NEGATIVE
Urobilinogen, UA: 0.2 (ref 0.0–1.0)
pH: 6 (ref 5.0–8.0)

## 2017-06-10 LAB — LIPID PANEL
CHOLESTEROL: 110 mg/dL (ref 0–200)
HDL: 57.3 mg/dL (ref >39.00)
LDL CALC: 44 mg/dL (ref 0–99)
NonHDL: 53.15
TRIGLYCERIDES: 44 mg/dL (ref 0.0–149.0)
Total CHOL/HDL Ratio: 2
VLDL: 8.8 mg/dL (ref 0.0–40.0)

## 2017-06-10 LAB — CBC WITH DIFFERENTIAL/PLATELET
BASOS PCT: 0.8 % (ref 0.0–3.0)
Basophils Absolute: 0 10*3/uL (ref 0.0–0.1)
Eosinophils Absolute: 0.2 10*3/uL (ref 0.0–0.7)
Eosinophils Relative: 6.6 % — ABNORMAL HIGH (ref 0.0–5.0)
HCT: 37.9 % — ABNORMAL LOW (ref 39.0–52.0)
Hemoglobin: 12.9 g/dL — ABNORMAL LOW (ref 13.0–17.0)
LYMPHS ABS: 1 10*3/uL (ref 0.7–4.0)
LYMPHS PCT: 28.1 % (ref 12.0–46.0)
MCHC: 34 g/dL (ref 30.0–36.0)
MCV: 98.7 fl (ref 78.0–100.0)
MONOS PCT: 14.2 % — AB (ref 3.0–12.0)
Monocytes Absolute: 0.5 10*3/uL (ref 0.1–1.0)
NEUTROS ABS: 1.9 10*3/uL (ref 1.4–7.7)
NEUTROS PCT: 50.3 % (ref 43.0–77.0)
PLATELETS: 219 10*3/uL (ref 150.0–400.0)
RBC: 3.84 Mil/uL — ABNORMAL LOW (ref 4.22–5.81)
RDW: 12.1 % (ref 11.5–15.5)
WBC: 3.7 10*3/uL — ABNORMAL LOW (ref 4.0–10.5)

## 2017-06-10 LAB — HEPATIC FUNCTION PANEL
ALBUMIN: 4 g/dL (ref 3.5–5.2)
ALT: 13 U/L (ref 0–53)
AST: 20 U/L (ref 0–37)
Alkaline Phosphatase: 68 U/L (ref 39–117)
Bilirubin, Direct: 0.1 mg/dL (ref 0.0–0.3)
Total Bilirubin: 0.7 mg/dL (ref 0.2–1.2)
Total Protein: 7 g/dL (ref 6.0–8.3)

## 2017-06-10 LAB — BASIC METABOLIC PANEL
BUN: 11 mg/dL (ref 6–23)
CHLORIDE: 103 meq/L (ref 96–112)
CO2: 32 mEq/L (ref 19–32)
Calcium: 9.4 mg/dL (ref 8.4–10.5)
Creatinine, Ser: 1 mg/dL (ref 0.40–1.50)
GFR: 81.28 mL/min (ref >60.00)
Glucose, Bld: 90 mg/dL (ref 70–99)
POTASSIUM: 4.7 meq/L (ref 3.5–5.1)
SODIUM: 139 meq/L (ref 135–145)

## 2017-06-10 LAB — TSH: TSH: 0.95 u[IU]/mL (ref 0.35–4.50)

## 2017-06-10 LAB — PSA: PSA: 0.35 ng/mL (ref 0.10–4.00)

## 2017-06-10 NOTE — Patient Instructions (Signed)

## 2017-06-10 NOTE — Progress Notes (Signed)
Subjective:    Patient ID: Kyle Morse, male    DOB: 06-07-1960, 57 y.o.   MRN: [id number]  HPI  Here for wellness and f/u;  Overall doing ok;  Pt denies Chest pain, worsening SOB, DOE, wheezing, orthopnea, PND, worsening LE edema, palpitations, dizziness or syncope.  Pt denies neurological change such as new headache, facial or extremity weakness.  Pt denies polydipsia, polyuria, or low sugar symptoms. Pt states overall good compliance with treatment and medications, good tolerability, and has been trying to follow appropriate diet.  Pt denies worsening depressive symptoms, suicidal ideation or panic. No fever, night sweats, wt loss, loss of appetite, or other constitutional symptoms.  Pt states good ability with ADL's, has low fall risk, home safety reviewed and adequate, no other significant changes in hearing or vision, and only occasionally active with exercise. 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 No other interval hx or complaints Past Medical History:  Diagnosis Date  . ELEVATED BLOOD PRESSURE   . ERECTILE DYSFUNCTION, ORGANIC   . FOLLICULITIS   . Lupus erythematosus    Past Surgical History:  Procedure Laterality Date  . NO PAST SURGERIES      reports that he has been smoking cigars.  He has been smoking about 1.00 pack per day. he has never used smokeless tobacco. He reports that he drinks about 16.8 oz of alcohol per week. He reports that he does not use drugs. family history includes Peptic Ulcer Disease in his mother. No Known Allergies Current Outpatient Medications on File Prior to Visit  Medication Sig Dispense Refill  . hydroxychloroquine (PLAQUENIL) 200 MG tablet Take 1 tablet (200 mg total) by mouth 2 times daily. Needs office visit for more refills. 90 tablet 0  . Multiple Vitamin (MULTIVITAMIN) tablet Take 1 tablet by mouth daily.       No  current facility-administered medications on file prior to visit.    Review of Systems Constitutional: Negative for other unusual diaphoresis, sweats, appetite or weight changes HENT: Negative for other worsening hearing loss, ear pain, facial swelling, mouth sores or neck stiffness.   Eyes: Negative for other worsening pain, redness or other visual disturbance.  Respiratory: Negative for other stridor or swelling Cardiovascular: Negative for other palpitations or other chest pain  Gastrointestinal: Negative for worsening diarrhea or loose stools, blood in stool, distention or other pain Genitourinary: Negative for hematuria, flank pain or other change in urine volume.  Musculoskeletal: Negative for myalgias or other joint swelling.  Skin: Negative for other color change, or other wound or worsening drainage.  Neurological: Negative for other syncope or numbness. Hematological: Negative for other adenopathy or swelling Psychiatric/Behavioral: Negative for hallucinations, other worsening agitation, SI, self-injury, or new decreased concentration All other system neg per pt    Objective:   Physical Exam BP (!) 142/90   Pulse 75   Temp 98.4 F (36.9 C) (Oral)   Ht 5' 9.5" (1.765 m)   Wt 181 lb (82.1 kg)   SpO2 98%   BMI 26.35 kg/m  VS noted,  Constitutional: Pt is oriented to person, place, and time. Appears well-developed and well-nourished, in no significant distress and comfortable Head: Normocephalic and atraumatic  Eyes: Conjunctivae and EOM are normal. Pupils are equal, round, and reactive to light Right Ear: External ear normal without discharge Left Ear: External ear normal without discharge  Nose: Nose without discharge or deformity Mouth/Throat: Oropharynx is without other ulcerations and moist  Neck: Normal range of motion. Neck supple. No JVD present. No tracheal deviation present or significant neck LA or mass Cardiovascular: Normal rate, regular rhythm, normal heart  sounds and intact distal pulses.   Pulmonary/Chest: WOB normal and breath sounds without rales or wheezing  Abdominal: Soft. Bowel sounds are normal. NT. No HSM  Musculoskeletal: Normal range of motion. Exhibits no edema Lymphadenopathy: Has no other cervical adenopathy.  Neurological: Pt is alert and oriented to person, place, and time. Pt has normal reflexes. No cranial nerve deficit. Motor grossly intact, Gait intact Skin: Skin is warm and dry. No rash noted or new ulcerations Psychiatric:  Has normal mood and affect. Behavior is normal without agitation No other exam findings Lab Results  Component Value Date   WBC 3.7 (L) 06/10/2017   HGB 12.9 (L) 06/10/2017   HCT 37.9 (L) 06/10/2017   PLT 219.0 06/10/2017   GLUCOSE 90 06/10/2017   CHOL 110 06/10/2017   TRIG 44.0 06/10/2017   HDL 57.30 06/10/2017   LDLCALC 44 06/10/2017   ALT 13 06/10/2017   AST 20 06/10/2017   NA 139 06/10/2017   K 4.7 06/10/2017   CL 103 06/10/2017   CREATININE 1.00 06/10/2017   BUN 11 06/10/2017   CO2 32 06/10/2017   TSH 0.95 06/10/2017   PSA 0.35 06/10/2017   HGBA1C 5.3 05/30/2013       Assessment & Plan:

## 2017-06-11 LAB — HIV ANTIBODY (ROUTINE TESTING W REFLEX): HIV 1&2 Ab, 4th Generation: NONREACTIVE

## 2017-06-11 LAB — HEPATITIS C ANTIBODY
HEP C AB: NONREACTIVE
SIGNAL TO CUT-OFF: 0.03 (ref <1.00)

## 2017-06-12 NOTE — Assessment & Plan Note (Signed)

## 2017-06-12 NOTE — Assessment & Plan Note (Signed)
Ok for referral for routine f/u with dermatology, doing well currently without lesions

## 2017-08-27 ENCOUNTER — Other Ambulatory Visit: Payer: Self-pay | Admitting: *Deleted

## 2017-08-28 NOTE — Telephone Encounter (Signed)
The plaquinil is not a medication that normally falls in my scope of practice  I could do refill for 3 months, but pt will need to be followed by specialist experienced in use of plaquinil  Let me know if pt would want referral to Rheumatology or Dermatology, and if needs a temporary refill

## 2017-08-29 MED ORDER — HYDROXYCHLOROQUINE SULFATE 200 MG PO TABS: ORAL_TABLET | ORAL | 0 refills | Status: DC

## 2017-08-29 NOTE — Telephone Encounter (Signed)
Spoke with patient notified of instructions, verbalized understanding. He states he has an appointment in June with his dermatologist. He is out of Plaquenil and would like a temporary refill. Please advise. Thanks

## 2017-08-29 NOTE — Telephone Encounter (Signed)
Done erx 

## 2017-10-18 ENCOUNTER — Other Ambulatory Visit: Payer: Self-pay | Admitting: Internal Medicine

## 2018-07-03 ENCOUNTER — Other Ambulatory Visit: Payer: Self-pay | Admitting: Internal Medicine

## 2018-09-07 ENCOUNTER — Other Ambulatory Visit: Payer: Self-pay | Admitting: Internal Medicine

## 2018-11-14 ENCOUNTER — Other Ambulatory Visit: Payer: Self-pay | Admitting: Internal Medicine

## 2019-03-16 ENCOUNTER — Other Ambulatory Visit (INDEPENDENT_AMBULATORY_CARE_PROVIDER_SITE_OTHER): Payer: PRIVATE HEALTH INSURANCE

## 2019-03-16 ENCOUNTER — Encounter: Payer: Self-pay | Admitting: Internal Medicine

## 2019-03-16 ENCOUNTER — Ambulatory Visit (INDEPENDENT_AMBULATORY_CARE_PROVIDER_SITE_OTHER): Payer: PRIVATE HEALTH INSURANCE | Admitting: Internal Medicine

## 2019-03-16 ENCOUNTER — Other Ambulatory Visit: Payer: Self-pay

## 2019-03-16 VITALS — BP 136/88 | HR 74 | Temp 98.0°F | Ht 69.5 in | Wt 179.0 lb

## 2019-03-16 DIAGNOSIS — M1A09X0 Idiopathic chronic gout, multiple sites, without tophus (tophi): Secondary | ICD-10-CM

## 2019-03-16 DIAGNOSIS — Z0001 Encounter for general adult medical examination with abnormal findings: Secondary | ICD-10-CM | POA: Diagnosis not present

## 2019-03-16 DIAGNOSIS — E538 Deficiency of other specified B group vitamins: Secondary | ICD-10-CM | POA: Diagnosis not present

## 2019-03-16 DIAGNOSIS — L930 Discoid lupus erythematosus: Secondary | ICD-10-CM

## 2019-03-16 DIAGNOSIS — Z0000 Encounter for general adult medical examination without abnormal findings: Secondary | ICD-10-CM

## 2019-03-16 DIAGNOSIS — E559 Vitamin D deficiency, unspecified: Secondary | ICD-10-CM

## 2019-03-16 DIAGNOSIS — R030 Elevated blood-pressure reading, without diagnosis of hypertension: Secondary | ICD-10-CM

## 2019-03-16 DIAGNOSIS — E611 Iron deficiency: Secondary | ICD-10-CM

## 2019-03-16 DIAGNOSIS — N529 Male erectile dysfunction, unspecified: Secondary | ICD-10-CM

## 2019-03-16 LAB — BASIC METABOLIC PANEL
BUN: 13 mg/dL (ref 6–23)
CO2: 29 mEq/L (ref 19–32)
Calcium: 9.1 mg/dL (ref 8.4–10.5)
Chloride: 104 mEq/L (ref 96–112)
Creatinine, Ser: 0.95 mg/dL (ref 0.40–1.50)
GFR: 80.66 mL/min (ref >60.00)
Glucose, Bld: 94 mg/dL (ref 70–99)
Potassium: 4.3 mEq/L (ref 3.5–5.1)
Sodium: 139 mEq/L (ref 135–145)

## 2019-03-16 LAB — CBC WITH DIFFERENTIAL/PLATELET
Basophils Absolute: 0 10*3/uL (ref 0.0–0.1)
Basophils Relative: 1.2 % (ref 0.0–3.0)
Eosinophils Absolute: 0.4 10*3/uL (ref 0.0–0.7)
Eosinophils Relative: 9.4 % — ABNORMAL HIGH (ref 0.0–5.0)
HCT: 38.6 % — ABNORMAL LOW (ref 39.0–52.0)
Hemoglobin: 12.9 g/dL — ABNORMAL LOW (ref 13.0–17.0)
Lymphocytes Relative: 30.9 % (ref 12.0–46.0)
Lymphs Abs: 1.2 10*3/uL (ref 0.7–4.0)
MCHC: 33.3 g/dL (ref 30.0–36.0)
MCV: 98.3 fl (ref 78.0–100.0)
Monocytes Absolute: 0.5 10*3/uL (ref 0.1–1.0)
Monocytes Relative: 12.9 % — ABNORMAL HIGH (ref 3.0–12.0)
Neutro Abs: 1.7 10*3/uL (ref 1.4–7.7)
Neutrophils Relative %: 45.6 % (ref 43.0–77.0)
Platelets: 243 10*3/uL (ref 150.0–400.0)
RBC: 3.93 Mil/uL — ABNORMAL LOW (ref 4.22–5.81)
RDW: 14 % (ref 11.5–15.5)
WBC: 3.8 10*3/uL — ABNORMAL LOW (ref 4.0–10.5)

## 2019-03-16 LAB — LIPID PANEL
Cholesterol: 129 mg/dL (ref 0–200)
HDL: 56.4 mg/dL (ref >39.00)
LDL Cholesterol: 61 mg/dL (ref 0–99)
NonHDL: 72.31
Total CHOL/HDL Ratio: 2
Triglycerides: 57 mg/dL (ref 0.0–149.0)
VLDL: 11.4 mg/dL (ref 0.0–40.0)

## 2019-03-16 LAB — HEPATIC FUNCTION PANEL
ALT: 11 U/L (ref 0–53)
AST: 18 U/L (ref 0–37)
Albumin: 4.2 g/dL (ref 3.5–5.2)
Alkaline Phosphatase: 83 U/L (ref 39–117)
Bilirubin, Direct: 0.1 mg/dL (ref 0.0–0.3)
Total Bilirubin: 0.4 mg/dL (ref 0.2–1.2)
Total Protein: 7.5 g/dL (ref 6.0–8.3)

## 2019-03-16 LAB — IBC PANEL
Iron: 50 ug/dL (ref 42–165)
Saturation Ratios: 14 % — ABNORMAL LOW (ref 20.0–50.0)
Transferrin: 255 mg/dL (ref 212.0–360.0)

## 2019-03-16 MED ORDER — HYDROXYCHLOROQUINE SULFATE 200 MG PO TABS: ORAL_TABLET | ORAL | 3 refills | Status: DC

## 2019-03-16 NOTE — Patient Instructions (Signed)

## 2019-03-17 ENCOUNTER — Encounter: Payer: Self-pay | Admitting: Internal Medicine

## 2019-03-17 NOTE — Assessment & Plan Note (Addendum)
For f/u lab, stable overall by history and exam, recent data reviewed with pt, and pt to continue medical treatment as before,  to f/u any worsening symptoms or concerns 

## 2019-03-17 NOTE — Assessment & Plan Note (Signed)
Ok for viagra prn,  to f/u any worsening symptoms or concerns  

## 2019-03-17 NOTE — Assessment & Plan Note (Signed)
stable overall by history and exam, recent data reviewed with pt, and pt to continue medical treatment as before,  to f/u any worsening symptoms or concerns  

## 2019-03-17 NOTE — Progress Notes (Signed)
Subjective:    Patient ID: Kyle Morse, male    DOB: [birth year], 58 y.o.   MRN: [id number]  HPI   Here for wellness and f/u;  Overall doing ok;  Pt denies Chest pain, worsening SOB, DOE, wheezing, orthopnea, PND, worsening LE edema, palpitations, dizziness or syncope.  Pt denies neurological change such as new headache, facial or extremity weakness.  Pt denies polydipsia, polyuria, or low sugar symptoms. Pt states overall good compliance with treatment and medications, good tolerability, and has been trying to follow appropriate diet.  Pt denies worsening depressive symptoms, suicidal ideation or panic. No fever, night sweats, wt loss, loss of appetite, or other constitutional symptoms.  Pt states good ability with ADL's, has low fall risk, home safety reviewed and adequate, no other significant changes in hearing or vision, and only occasionally active with exercise.  Also c/o persistent discoid lupus needs med refill, cannot afford specialty care, med working well, asks for ongoing refill, has not seen optho recently.   Also with worsening ED symptoms over the past 6 mo, asking for viagra.  States no recent gout symptoms.  Past Medical History:  Diagnosis Date  . ELEVATED BLOOD PRESSURE   . ERECTILE DYSFUNCTION, ORGANIC   . FOLLICULITIS   . Lupus erythematosus    Past Surgical History:  Procedure Laterality Date  . NO PAST SURGERIES      reports that he has been smoking cigars. He has been smoking about 1.00 pack per day. He has never used smokeless tobacco. He reports current alcohol use of about 28.0 standard drinks of alcohol per week. He reports that he does not use drugs. family history includes Peptic Ulcer Disease in his mother. No Known Allergies Current Outpatient Medications on File Prior to Visit  Medication Sig Dispense Refill  . Multiple Vitamin (MULTIVITAMIN) tablet Take 1 tablet by mouth daily.       No current facility-administered medications on file prior to visit.     Review of Systems Constitutional: Negative for other unusual diaphoresis, sweats, appetite or weight changes HENT: Negative for other worsening hearing loss, ear pain, facial swelling, mouth sores or neck stiffness.   Eyes: Negative for other worsening pain, redness or other visual disturbance.  Respiratory: Negative for other stridor or swelling Cardiovascular: Negative for other palpitations or other chest pain  Gastrointestinal: Negative for worsening diarrhea or loose stools, blood in stool, distention or other pain Genitourinary: Negative for hematuria, flank pain or other change in urine volume.  Musculoskeletal: Negative for myalgias or other joint swelling.  Skin: Negative for other color change, or other wound or worsening drainage.  Neurological: Negative for other syncope or numbness. Hematological: Negative for other adenopathy or swelling Psychiatric/Behavioral: Negative for hallucinations, other worsening agitation, SI, self-injury, or new decreased concentration All otherwise neg per pt     Objective:   Physical Exam BP 136/88   Pulse 74   Temp 98 F (36.7 C) (Oral)   Ht 5' 9.5" (1.765 m)   Wt 179 lb (81.2 kg)   SpO2 98%   BMI 26.05 kg/m  VS noted,  Constitutional: Pt is oriented to person, place, and time. Appears well-developed and well-nourished, in no significant distress and comfortable Head: Normocephalic and atraumatic  Eyes: Conjunctivae and EOM are normal. Pupils are equal, round, and reactive to light Right Ear: External ear normal without discharge Left Ear: External ear normal without discharge Nose: Nose without discharge or deformity Mouth/Throat: Oropharynx is without other ulcerations and moist  Neck: Normal range of motion. Neck supple. No JVD present. No tracheal deviation present or significant neck LA or mass Cardiovascular: Normal rate, regular rhythm, normal heart sounds and intact distal pulses.   Pulmonary/Chest: WOB normal and breath  sounds without rales or wheezing  Abdominal: Soft. Bowel sounds are normal. NT. No HSM  Musculoskeletal: Normal range of motion. Exhibits no edema Lymphadenopathy: Has no other cervical adenopathy.  Neurological: Pt is alert and oriented to person, place, and time. Pt has normal reflexes. No cranial nerve deficit. Motor grossly intact, Gait intact Skin: Skin is warm and dry. No rash noted or new ulcerations Psychiatric:  Has normal mood and affect. Behavior is normal without agitation All otherwise neg per pt Lab Results  Component Value Date   WBC 3.7 (L) 06/10/2017   HGB 12.9 (L) 06/10/2017   HCT 37.9 (L) 06/10/2017   PLT 219.0 06/10/2017   GLUCOSE 90 06/10/2017   CHOL 110 06/10/2017   TRIG 44.0 06/10/2017   HDL 57.30 06/10/2017   LDLCALC 44 06/10/2017   ALT 13 06/10/2017   AST 20 06/10/2017   NA 139 06/10/2017   K 4.7 06/10/2017   CL 103 06/10/2017   CREATININE 1.00 06/10/2017   BUN 11 06/10/2017   CO2 32 06/10/2017   TSH 0.95 06/10/2017   PSA 0.35 06/10/2017   HGBA1C 5.3 05/30/2013      Assessment & Plan:

## 2019-03-17 NOTE — Assessment & Plan Note (Signed)

## 2019-03-17 NOTE — Assessment & Plan Note (Addendum)
Ok for plaquinil refill, cannot afford dermatology or rheumatology,to refer optho  In addition to the time spent performing CPE, I spent an additional 25 minutes face to face,in which greater than 50% of this time was spent in counseling and coordination of care for patient's acute illness as documented, including the differential dx, treatment, further evaluation and other management of discoid lupus, gout, ED and elevated BP

## 2019-03-18 ENCOUNTER — Encounter: Payer: Self-pay | Admitting: Internal Medicine

## 2019-03-18 LAB — TSH: TSH: 0.97 u[IU]/mL (ref 0.35–4.50)

## 2019-03-18 LAB — VITAMIN B12: Vitamin B-12: 471 pg/mL (ref 211–911)

## 2019-03-18 LAB — VITAMIN D 25 HYDROXY (VIT D DEFICIENCY, FRACTURES): VITD: 32.86 ng/mL (ref 30.00–100.00)

## 2019-03-18 LAB — PSA: PSA: 0.43 ng/mL (ref 0.10–4.00)

## 2019-12-17 ENCOUNTER — Encounter: Payer: Self-pay | Admitting: Internal Medicine

## 2019-12-17 ENCOUNTER — Other Ambulatory Visit: Payer: Self-pay

## 2019-12-17 ENCOUNTER — Ambulatory Visit: Payer: PRIVATE HEALTH INSURANCE | Admitting: Internal Medicine

## 2019-12-17 DIAGNOSIS — L930 Discoid lupus erythematosus: Secondary | ICD-10-CM

## 2019-12-17 DIAGNOSIS — B029 Zoster without complications: Secondary | ICD-10-CM

## 2019-12-17 DIAGNOSIS — M1A09X0 Idiopathic chronic gout, multiple sites, without tophus (tophi): Secondary | ICD-10-CM | POA: Diagnosis not present

## 2019-12-17 NOTE — Progress Notes (Signed)
Subjective:    Patient ID: Kyle Morse, male    DOB: February 16, 1961, 59 y.o.   MRN: [id number]  HPI  Here after wife finally made him come in to have rash to llQ checked, has been present for 6 days, constant, mild itchy and fortunately very little to no real discomfort, nothing makes better or worse except heating pad can help make better.  No prior hx of shingles but wife concerned about mumps and measles as well after internet search.  No fever,.  Pt denies chest pain, increased sob or doe, wheezing, orthopnea, PND, increased LE swelling, palpitations, dizziness or syncope.  Pt denies new neurological symptoms such as new headache, or facial or extremity weakness or numbness  Pt denies polydipsia, polyuria Past Medical History:  Diagnosis Date  . ELEVATED BLOOD PRESSURE   . ERECTILE DYSFUNCTION, ORGANIC   . FOLLICULITIS   . Lupus erythematosus    Past Surgical History:  Procedure Laterality Date  . NO PAST SURGERIES      reports that he has been smoking cigars. He has been smoking about 1.00 pack per day. He has never used smokeless tobacco. He reports current alcohol use of about 28.0 standard drinks of alcohol per week. He reports that he does not use drugs. family history includes Peptic Ulcer Disease in his mother. No Known Allergies Current Outpatient Medications on File Prior to Visit  Medication Sig Dispense Refill  . hydroxychloroquine (PLAQUENIL) 200 MG tablet 1 tab by mouth twice per day 180 tablet 3  . Multiple Vitamin (MULTIVITAMIN) tablet Take 1 tablet by mouth daily.       No current facility-administered medications on file prior to visit.   Review of Systems All otherwise neg per pt     Objective:   Physical Exam BP (!) 180/90 (BP Location: Left Arm, Patient Position: Sitting, Cuff Size: Large)   Pulse 85   Temp 98.9 F (37.2 C) (Oral)   Ht 5' 9.5" (1.765 m)   Wt 189 lb (85.7 kg)   SpO2 95%   BMI 27.51 kg/m  VS noted,  Constitutional: Pt appears in  NAD HENT: Head: NCAT.  Right Ear: External ear normal.  Left Ear: External ear normal.  Eyes: . Pupils are equal, round, and reactive to light. Conjunctivae and EOM are normal Nose: without d/c or deformity Neck: Neck supple. Gross normal ROM Cardiovascular: Normal rate and regular rhythm.   Pulmonary/Chest: Effort normal and breath sounds without rales or wheezing.  Abd:  Soft, NT, ND, + BS, no organomegaly Neurological: Pt is alert. At baseline orientation, motor grossly intact Skin: Skin is warm. no LE edema, lLQ abd with mlutiple grouped vesicles on erythem base, the majority of which already scabbed over Psychiatric: Pt behavior is normal without agitation  All otherwise neg per pt Lab Results  Component Value Date   WBC 3.8 (L) 03/16/2019   HGB 12.9 (L) 03/16/2019   HCT 38.6 (L) 03/16/2019   PLT 243.0 03/16/2019   GLUCOSE 94 03/16/2019   CHOL 129 03/16/2019   TRIG 57.0 03/16/2019   HDL 56.40 03/16/2019   LDLCALC 61 03/16/2019   ALT 11 03/16/2019   AST 18 03/16/2019   NA 139 03/16/2019   K 4.3 03/16/2019   CL 104 03/16/2019   CREATININE 0.95 03/16/2019   BUN 13 03/16/2019   CO2 29 03/16/2019   TSH 0.97 03/16/2019   PSA 0.43 03/16/2019   HGBA1C 5.3 05/30/2013         Assessment &  Plan:

## 2019-12-19 ENCOUNTER — Encounter: Payer: Self-pay | Admitting: Internal Medicine

## 2019-12-19 NOTE — Assessment & Plan Note (Signed)
stable overall by history and exam, recent data reviewed with pt, and pt to continue medical treatment as before,  to f/u any worsening symptoms or concerns  

## 2019-12-19 NOTE — Patient Instructions (Signed)
Please continue all other medications as before, and refills have been done if requested.  Please have the pharmacy call with any other refills you may need.  Please continue your efforts at being more active, low cholesterol diet, and weight control.  Please keep your appointments with your specialists as you may have planned     

## 2019-12-19 NOTE — Assessment & Plan Note (Addendum)
D/w pt natural hx, no specific tx needed at this time, will take 2-4 wks to finally improve last of rash though some post inflammatory hyperpigmentation can remain many months, all other quesitons answered, reassured  I spent 31 minutes in preparing to see the patient by review of recent labs, imaging and procedures, obtaining and reviewing separately obtained history, communicating with the patient and family or caregiver, ordering medications, tests or procedures, and documenting clinical information in the EHR including the differential Dx, treatment, and any further evaluation and other management of shingles, gout, discoud lupus

## 2020-03-15 ENCOUNTER — Other Ambulatory Visit: Payer: Self-pay

## 2020-03-16 ENCOUNTER — Ambulatory Visit (INDEPENDENT_AMBULATORY_CARE_PROVIDER_SITE_OTHER): Payer: PRIVATE HEALTH INSURANCE | Admitting: Internal Medicine

## 2020-03-16 ENCOUNTER — Encounter: Payer: Self-pay | Admitting: Internal Medicine

## 2020-03-16 VITALS — BP 180/100 | HR 68 | Temp 98.2°F | Ht 69.5 in | Wt 191.0 lb

## 2020-03-16 DIAGNOSIS — I10 Essential (primary) hypertension: Secondary | ICD-10-CM

## 2020-03-16 DIAGNOSIS — Z0000 Encounter for general adult medical examination without abnormal findings: Secondary | ICD-10-CM

## 2020-03-16 DIAGNOSIS — B029 Zoster without complications: Secondary | ICD-10-CM

## 2020-03-16 LAB — BASIC METABOLIC PANEL
BUN: 11 mg/dL (ref 6–23)
CO2: 30 mEq/L (ref 19–32)
Calcium: 9.3 mg/dL (ref 8.4–10.5)
Chloride: 101 mEq/L (ref 96–112)
Creatinine, Ser: 1.03 mg/dL (ref 0.40–1.50)
GFR: 78.25 mL/min (ref >60.00)
Glucose, Bld: 79 mg/dL (ref 70–99)
Potassium: 4.4 mEq/L (ref 3.5–5.1)
Sodium: 137 mEq/L (ref 135–145)

## 2020-03-16 LAB — CBC WITH DIFFERENTIAL/PLATELET
Basophils Absolute: 0.1 10*3/uL (ref 0.0–0.1)
Basophils Relative: 1.5 % (ref 0.0–3.0)
Eosinophils Absolute: 0.3 10*3/uL (ref 0.0–0.7)
Eosinophils Relative: 5.6 % — ABNORMAL HIGH (ref 0.0–5.0)
HCT: 39.3 % (ref 39.0–52.0)
Hemoglobin: 13.5 g/dL (ref 13.0–17.0)
Lymphocytes Relative: 24.5 % (ref 12.0–46.0)
Lymphs Abs: 1.2 10*3/uL (ref 0.7–4.0)
MCHC: 34.3 g/dL (ref 30.0–36.0)
MCV: 98.9 fl (ref 78.0–100.0)
Monocytes Absolute: 0.5 10*3/uL (ref 0.1–1.0)
Monocytes Relative: 9.9 % (ref 3.0–12.0)
Neutro Abs: 2.8 10*3/uL (ref 1.4–7.7)
Neutrophils Relative %: 58.5 % (ref 43.0–77.0)
Platelets: 251 10*3/uL (ref 150.0–400.0)
RBC: 3.98 Mil/uL — ABNORMAL LOW (ref 4.22–5.81)
RDW: 13.4 % (ref 11.5–15.5)
WBC: 4.7 10*3/uL (ref 4.0–10.5)

## 2020-03-16 LAB — HEPATIC FUNCTION PANEL
ALT: 15 U/L (ref 0–53)
AST: 23 U/L (ref 0–37)
Albumin: 4.2 g/dL (ref 3.5–5.2)
Alkaline Phosphatase: 81 U/L (ref 39–117)
Bilirubin, Direct: 0.1 mg/dL (ref 0.0–0.3)
Total Bilirubin: 0.6 mg/dL (ref 0.2–1.2)
Total Protein: 7.2 g/dL (ref 6.0–8.3)

## 2020-03-16 LAB — LIPID PANEL
Cholesterol: 129 mg/dL (ref 0–200)
HDL: 65.6 mg/dL (ref >39.00)
LDL Cholesterol: 54 mg/dL (ref 0–99)
NonHDL: 63.53
Total CHOL/HDL Ratio: 2
Triglycerides: 50 mg/dL (ref 0.0–149.0)
VLDL: 10 mg/dL (ref 0.0–40.0)

## 2020-03-16 LAB — TSH: TSH: 0.82 u[IU]/mL (ref 0.35–4.50)

## 2020-03-16 LAB — PSA: PSA: 0.42 ng/mL (ref 0.10–4.00)

## 2020-03-16 NOTE — Addendum Note (Signed)
Addended by: Vincenza Hews on: 03/16/2020 09:17 AM   Modules accepted: Orders

## 2020-03-16 NOTE — Assessment & Plan Note (Signed)

## 2020-03-16 NOTE — Assessment & Plan Note (Signed)
Resolved asympt

## 2020-03-16 NOTE — Patient Instructions (Addendum)
Please check your BP at home on a regular basis; and check it once per day for 10 days, and call if the average is more than 140/90  Please continue all other medications as before, and refills have been done if requested.  Please have the pharmacy call with any other refills you may need.  Please continue your efforts at being more active, low cholesterol diet, and weight control.  You are otherwise up to date with prevention measures today.  Please keep your appointments with your specialists as you may have planned  Please make an Appointment to return for your 1 year visit, or sooner if needed

## 2020-03-16 NOTE — Assessment & Plan Note (Signed)
D/w pt, declines tx though I d/w pt risk of not treating including increased future risk of heart disease, stroke and renal failure

## 2020-03-16 NOTE — Progress Notes (Signed)
Subjective:    Patient ID: Kyle Morse, male    DOB: 02-Feb-1961, 59 y.o.   MRN: [id number]  HPI  Here for wellness and f/u;  Overall doing ok;  Pt denies Chest pain, worsening SOB, DOE, wheezing, orthopnea, PND, worsening LE edema, palpitations, dizziness or syncope.  Pt denies neurological change such as new headache, facial or extremity weakness.  Pt denies polydipsia, polyuria, or low sugar symptoms. Pt states overall good compliance with treatment and medications, good tolerability, and has been trying to follow appropriate diet.  Pt denies worsening depressive symptoms, suicidal ideation or panic. No fever, night sweats, wt loss, loss of appetite, or other constitutional symptoms.  Pt states good ability with ADL's, has low fall risk, home safety reviewed and adequate, no other significant changes in hearing or vision, and only occasionally active with exercise. No new compliants BP Readings from Last 3 Encounters:  03/16/20 (!) 180/100  12/17/19 (!) 180/90  03/16/19 136/88   Wt Readings from Last 3 Encounters:  03/16/20 191 lb (86.6 kg)  12/17/19 189 lb (85.7 kg)  03/16/19 179 lb (81.2 kg)   Past Medical History:  Diagnosis Date  . ELEVATED BLOOD PRESSURE   . ERECTILE DYSFUNCTION, ORGANIC   . FOLLICULITIS   . Lupus erythematosus    Past Surgical History:  Procedure Laterality Date  . NO PAST SURGERIES      reports that he has been smoking cigars. He has been smoking about 1.00 pack per day. He has never used smokeless tobacco. He reports current alcohol use of about 28.0 standard drinks of alcohol per week. He reports that he does not use drugs. family history includes Peptic Ulcer Disease in his mother. No Known Allergies Current Outpatient Medications on File Prior to Visit  Medication Sig Dispense Refill  . hydroxychloroquine (PLAQUENIL) 200 MG tablet 1 tab by mouth twice per day 180 tablet 3  . Multiple Vitamin (MULTIVITAMIN) tablet Take 1 tablet by mouth daily.         No current facility-administered medications on file prior to visit.   Review of Systems All otherwise neg per pt    Objective:   Physical Exam BP (!) 180/100 (BP Location: Left Arm, Patient Position: Sitting, Cuff Size: Large)   Pulse 68   Temp 98.2 F (36.8 C) (Oral)   Ht 5' 9.5" (1.765 m)   Wt 191 lb (86.6 kg)   SpO2 98%   BMI 27.80 kg/m  VS noted,  Constitutional: Pt appears in NAD HENT: Head: NCAT.  Right Ear: External ear normal.  Left Ear: External ear normal.  Eyes: . Pupils are equal, round, and reactive to light. Conjunctivae and EOM are normal Nose: without d/c or deformity Neck: Neck supple. Gross normal ROM Cardiovascular: Normal rate and regular rhythm.   Pulmonary/Chest: Effort normal and breath sounds without rales or wheezing.  Abd:  Soft, NT, ND, + BS, no organomegaly Neurological: Pt is alert. At baseline orientation, motor grossly intact Skin: Skin is warm. No rashes, other new lesions, no LE edema Psychiatric: Pt behavior is normal without agitation  All otherwise neg per pt Lab Results  Component Value Date   WBC 3.8 (L) 03/16/2019   HGB 12.9 (L) 03/16/2019   HCT 38.6 (L) 03/16/2019   PLT 243.0 03/16/2019   GLUCOSE 94 03/16/2019   CHOL 129 03/16/2019   TRIG 57.0 03/16/2019   HDL 56.40 03/16/2019   LDLCALC 61 03/16/2019   ALT 11 03/16/2019   AST 18 03/16/2019  NA 139 03/16/2019   K 4.3 03/16/2019   CL 104 03/16/2019   CREATININE 0.95 03/16/2019   BUN 13 03/16/2019   CO2 29 03/16/2019   TSH 0.97 03/16/2019   PSA 0.43 03/16/2019   HGBA1C 5.3 05/30/2013      Assessment & Plan:

## 2020-03-28 ENCOUNTER — Other Ambulatory Visit: Payer: Self-pay | Admitting: Internal Medicine

## 2020-08-21 ENCOUNTER — Encounter: Payer: Self-pay | Admitting: Internal Medicine

## 2020-08-21 ENCOUNTER — Ambulatory Visit: Payer: PRIVATE HEALTH INSURANCE | Admitting: Internal Medicine

## 2020-08-21 ENCOUNTER — Other Ambulatory Visit: Payer: Self-pay

## 2020-08-21 VITALS — BP 190/105 | HR 85 | Temp 98.3°F | Ht 69.5 in | Wt 185.0 lb

## 2020-08-21 DIAGNOSIS — F17200 Nicotine dependence, unspecified, uncomplicated: Secondary | ICD-10-CM

## 2020-08-21 DIAGNOSIS — E538 Deficiency of other specified B group vitamins: Secondary | ICD-10-CM

## 2020-08-21 DIAGNOSIS — E559 Vitamin D deficiency, unspecified: Secondary | ICD-10-CM | POA: Diagnosis not present

## 2020-08-21 DIAGNOSIS — Z0000 Encounter for general adult medical examination without abnormal findings: Secondary | ICD-10-CM

## 2020-08-21 DIAGNOSIS — I10 Essential (primary) hypertension: Secondary | ICD-10-CM

## 2020-08-21 DIAGNOSIS — R011 Cardiac murmur, unspecified: Secondary | ICD-10-CM | POA: Diagnosis not present

## 2020-08-21 DIAGNOSIS — Z0001 Encounter for general adult medical examination with abnormal findings: Secondary | ICD-10-CM

## 2020-08-21 LAB — URINALYSIS, ROUTINE W REFLEX MICROSCOPIC
Bilirubin Urine: NEGATIVE
Hgb urine dipstick: NEGATIVE
Ketones, ur: NEGATIVE
Nitrite: NEGATIVE
RBC / HPF: NONE SEEN (ref 0–?)
Specific Gravity, Urine: 1.02 (ref 1.000–1.030)
Total Protein, Urine: NEGATIVE
Urine Glucose: NEGATIVE
Urobilinogen, UA: 0.2 (ref 0.0–1.0)
pH: 6 (ref 5.0–8.0)

## 2020-08-21 LAB — LIPID PANEL
Cholesterol: 126 mg/dL (ref 0–200)
HDL: 67 mg/dL (ref >39.00)
LDL Cholesterol: 48 mg/dL (ref 0–99)
NonHDL: 59.26
Total CHOL/HDL Ratio: 2
Triglycerides: 54 mg/dL (ref 0.0–149.0)
VLDL: 10.8 mg/dL (ref 0.0–40.0)

## 2020-08-21 LAB — CBC WITH DIFFERENTIAL/PLATELET
Basophils Absolute: 0.1 10*3/uL (ref 0.0–0.1)
Basophils Relative: 1.3 % (ref 0.0–3.0)
Eosinophils Absolute: 0.2 10*3/uL (ref 0.0–0.7)
Eosinophils Relative: 3.4 % (ref 0.0–5.0)
HCT: 33.8 % — ABNORMAL LOW (ref 39.0–52.0)
Hemoglobin: 11.3 g/dL — ABNORMAL LOW (ref 13.0–17.0)
Lymphocytes Relative: 18 % (ref 12.0–46.0)
Lymphs Abs: 1 10*3/uL (ref 0.7–4.0)
MCHC: 33.4 g/dL (ref 30.0–36.0)
MCV: 94.2 fl (ref 78.0–100.0)
Monocytes Absolute: 0.7 10*3/uL (ref 0.1–1.0)
Monocytes Relative: 12.7 % — ABNORMAL HIGH (ref 3.0–12.0)
Neutro Abs: 3.8 10*3/uL (ref 1.4–7.7)
Neutrophils Relative %: 64.6 % (ref 43.0–77.0)
Platelets: 334 10*3/uL (ref 150.0–400.0)
RBC: 3.59 Mil/uL — ABNORMAL LOW (ref 4.22–5.81)
RDW: 13.8 % (ref 11.5–15.5)
WBC: 5.8 10*3/uL (ref 4.0–10.5)

## 2020-08-21 LAB — HEPATIC FUNCTION PANEL
ALT: 12 U/L (ref 0–53)
AST: 21 U/L (ref 0–37)
Albumin: 4 g/dL (ref 3.5–5.2)
Alkaline Phosphatase: 79 U/L (ref 39–117)
Bilirubin, Direct: 0.1 mg/dL (ref 0.0–0.3)
Total Bilirubin: 0.5 mg/dL (ref 0.2–1.2)
Total Protein: 7.8 g/dL (ref 6.0–8.3)

## 2020-08-21 LAB — BASIC METABOLIC PANEL
BUN: 16 mg/dL (ref 6–23)
CO2: 29 mEq/L (ref 19–32)
Calcium: 9.2 mg/dL (ref 8.4–10.5)
Chloride: 98 mEq/L (ref 96–112)
Creatinine, Ser: 1.15 mg/dL (ref 0.40–1.50)
GFR: 68.35 mL/min (ref >60.00)
Glucose, Bld: 83 mg/dL (ref 70–99)
Potassium: 4.6 mEq/L (ref 3.5–5.1)
Sodium: 134 mEq/L — ABNORMAL LOW (ref 135–145)

## 2020-08-21 LAB — VITAMIN B12: Vitamin B-12: 433 pg/mL (ref 211–911)

## 2020-08-21 LAB — VITAMIN D 25 HYDROXY (VIT D DEFICIENCY, FRACTURES): VITD: 20.48 ng/mL — ABNORMAL LOW (ref 30.00–100.00)

## 2020-08-21 LAB — PSA: PSA: 0.82 ng/mL (ref 0.10–4.00)

## 2020-08-21 LAB — TSH: TSH: 0.97 u[IU]/mL (ref 0.35–4.50)

## 2020-08-21 MED ORDER — OLMESARTAN MEDOXOMIL 40 MG PO TABS: 40.0000 mg | ORAL_TABLET | Freq: Every day | ORAL | 3 refills | Status: DC

## 2020-08-21 MED ORDER — AMLODIPINE BESYLATE 5 MG PO TABS: 5.0000 mg | ORAL_TABLET | Freq: Every day | ORAL | 3 refills | Status: DC

## 2020-08-21 NOTE — Patient Instructions (Signed)
Please take all new medication as prescribed - the benicar (olmesartan) 40 mg per day  Please take all new medication as prescribed - the amlodipine (norvasc) 5 mg per day  You can call at any time if you want the order done for an Echocardiogram for the heart murmur  Please continue all other medications as before, and refills have been done if requested.  Please have the pharmacy call with any other refills you may need.  Please continue your efforts at being more active, low cholesterol diet, and weight control.  You are otherwise up to date with prevention measures today.  Please keep your appointments with your specialists as you may have planned  Please go to the LAB at the blood drawing area for the tests to be done  You will be contacted by phone if any changes need to be made immediately.  Otherwise, you will receive a letter about your results with an explanation, but please check with MyChart first.  Please remember to sign up for MyChart if you have not done so, as this will be important to you in the future with finding out test results, communicating by private email, and scheduling acute appointments online when needed.  Please make an Appointment to return in 3 weeks

## 2020-08-21 NOTE — Progress Notes (Signed)
Patient ID: Kyle Morse, male   DOB: November 09, 1960, 60 y.o.   MRN: [id number]         Chief Complaint:: wellness exam and Hypertension        HPI:  Kyle Morse is a 60 y.o. male here for wellness exam; declines colonoscopy and tdap, o/w up to date with preventive referrals and immunizations.                          Also no other specific complaints.  Pt denies chest pain, increased sob or doe, wheezing, orthopnea, PND, increased LE swelling, palpitations, dizziness or syncope.  Denies new neuro focal s/s.   Pt denies polydipsia, polyuria,  Pt denies fever, wt loss, night sweats, loss of appetite, or other constitutional symptoms  No other new complaints  Has lost several lbs with better diet recently   Pt denies fever, wt loss, night sweats, loss of appetite, or other constitutional symptoms    Wt Readings from Last 3 Encounters:  08/21/20 185 lb (83.9 kg)  03/16/20 191 lb (86.6 kg)  12/17/19 189 lb (85.7 kg)   BP Readings from Last 3 Encounters:  08/21/20 (!) 190/105  03/16/20 (!) 180/100  12/17/19 (!) 180/90   There is no immunization history on file for this patient.There are no preventive care reminders to display for this patient.    Past Medical History:  Diagnosis Date  . ELEVATED BLOOD PRESSURE   . ERECTILE DYSFUNCTION, ORGANIC   . FOLLICULITIS   . Lupus erythematosus    Past Surgical History:  Procedure Laterality Date  . NO PAST SURGERIES      reports that he has been smoking cigars. He has been smoking about 1.00 pack per day. He has never used smokeless tobacco. He reports current alcohol use of about 28.0 standard drinks of alcohol per week. He reports that he does not use drugs. family history includes Peptic Ulcer Disease in his mother. No Known Allergies Current Outpatient Medications on File Prior to Visit  Medication Sig Dispense Refill  . hydroxychloroquine (PLAQUENIL) 200 MG tablet TAKE 1 TABLET (200 MG TOTAL) BY MOUTH 2 TIMES DAILY 180 tablet 3  .  Multiple Vitamin (MULTIVITAMIN) tablet Take 1 tablet by mouth daily.     No current facility-administered medications on file prior to visit.        ROS:  All others reviewed and negative.  Objective        PE:  BP (!) 190/105 (BP Location: Left Arm, Patient Position: Sitting, Cuff Size: Large)   Pulse 85   Temp 98.3 F (36.8 C) (Oral)   Ht 5' 9.5" (1.765 m)   Wt 185 lb (83.9 kg)   SpO2 94%   BMI 26.93 kg/m                 Constitutional: Pt appears in NAD               HENT: Head: NCAT.                Right Ear: External ear normal.                 Left Ear: External ear normal.                Eyes: . Pupils are equal, round, and reactive to light. Conjunctivae and EOM are normal  Nose: without d/c or deformity               Neck: Neck supple. Gross normal ROM               Cardiovascular: Normal rate and regular rhythm.  Gr 2/6 sys murmur rusb               Pulmonary/Chest: Effort normal and breath sounds without rales or wheezing.                Abd:  Soft, NT, ND, + BS, no organomegaly               Neurological: Pt is alert. At baseline orientation, motor grossly intact               Skin: Skin is warm. No rashes, no other new lesions, LE edema - none               Psychiatric: Pt behavior is normal without agitation   Micro: none  Cardiac tracings I have personally interpreted today:  none  Pertinent Radiological findings (summarize): none   Lab Results  Component Value Date   WBC 5.8 08/21/2020   HGB 11.3 (L) 08/21/2020   HCT 33.8 (L) 08/21/2020   PLT 334.0 08/21/2020   GLUCOSE 83 08/21/2020   CHOL 126 08/21/2020   TRIG 54.0 08/21/2020   HDL 67.00 08/21/2020   LDLCALC 48 08/21/2020   ALT 12 08/21/2020   AST 21 08/21/2020   NA 134 (L) 08/21/2020   K 4.6 08/21/2020   CL 98 08/21/2020   CREATININE 1.15 08/21/2020   BUN 16 08/21/2020   CO2 29 08/21/2020   TSH 0.97 08/21/2020   PSA 0.82 08/21/2020   HGBA1C 5.3 05/30/2013   Assessment/Plan:   Kyle Morse is a 60 y.o. Other or two or more races [6] male with  has a past medical history of ELEVATED BLOOD PRESSURE, ERECTILE DYSFUNCTION, ORGANIC, FOLLICULITIS, and Lupus erythematosus.  Encounter for well adult exam with abnormal findings Age and sex appropriate education and counseling updated with regular exercise and diet Referrals for preventative services - declines colonoscopy Immunizations addressed - declines Tdap Smoking counseling  - counseled to quit, pt not ready Evidence for depression or other mood disorder - none significant Most recent labs reviewed. I have personally reviewed and have noted: 1) the patient's medical and social history 2) The patient's current medications and supplements 3) The patient's height, weight, and BMI have been recorded in the chart   Smoking counsled to quit, pt not ready  Uncontrolled hypertension Severe uncontrolled, to start olmesartan 40 and amlodipine 5 qd, f/u bp at home and next visit  Heart murmur Incidental, asympt, declines colonoscopy  Vitamin D deficiency Last vitamin D Lab Results  Component Value Date   VD25OH 20.48 (L) 08/21/2020   Low , to start oral replacement   Followup: Return in about 3 weeks (around 09/11/2020).  Oliver Barre, MD 08/26/2020 10:10 PM  [facility]

## 2020-08-26 ENCOUNTER — Encounter: Payer: Self-pay | Admitting: Internal Medicine

## 2020-08-26 DIAGNOSIS — E559 Vitamin D deficiency, unspecified: Secondary | ICD-10-CM | POA: Insufficient documentation

## 2020-08-26 DIAGNOSIS — F17200 Nicotine dependence, unspecified, uncomplicated: Secondary | ICD-10-CM | POA: Insufficient documentation

## 2020-08-26 NOTE — Assessment & Plan Note (Addendum)
Age and sex appropriate education and counseling updated with regular exercise and diet Referrals for preventative services - declines colonoscopy Immunizations addressed - declines Tdap Smoking counseling  - counseled to quit, pt not ready Evidence for depression or other mood disorder - none significant Most recent labs reviewed. I have personally reviewed and have noted: 1) the patient's medical and social history 2) The patient's current medications and supplements 3) The patient's height, weight, and BMI have been recorded in the chart

## 2020-08-26 NOTE — Assessment & Plan Note (Signed)
Last vitamin D Lab Results  Component Value Date   VD25OH 20.48 (L) 08/21/2020   Low , to start oral replacement

## 2020-08-26 NOTE — Assessment & Plan Note (Signed)
counsled to quit, pt not ready 

## 2020-08-26 NOTE — Assessment & Plan Note (Signed)
Incidental, asympt, declines colonoscopy

## 2020-08-26 NOTE — Assessment & Plan Note (Signed)
Severe uncontrolled, to start olmesartan 40 and amlodipine 5 qd, f/u bp at home and next visit

## 2021-02-21 ENCOUNTER — Ambulatory Visit: Payer: PRIVATE HEALTH INSURANCE | Admitting: Internal Medicine

## 2021-02-21 ENCOUNTER — Encounter: Payer: Self-pay | Admitting: Internal Medicine

## 2021-02-21 ENCOUNTER — Other Ambulatory Visit: Payer: Self-pay

## 2021-02-21 VITALS — BP 180/102 | HR 90 | Temp 98.8°F | Ht 69.5 in | Wt 187.0 lb

## 2021-02-21 DIAGNOSIS — E559 Vitamin D deficiency, unspecified: Secondary | ICD-10-CM

## 2021-02-21 DIAGNOSIS — E538 Deficiency of other specified B group vitamins: Secondary | ICD-10-CM

## 2021-02-21 DIAGNOSIS — Z0000 Encounter for general adult medical examination without abnormal findings: Secondary | ICD-10-CM

## 2021-02-21 DIAGNOSIS — F17200 Nicotine dependence, unspecified, uncomplicated: Secondary | ICD-10-CM | POA: Diagnosis not present

## 2021-02-21 DIAGNOSIS — I10 Essential (primary) hypertension: Secondary | ICD-10-CM | POA: Diagnosis not present

## 2021-02-21 MED ORDER — AMLODIPINE BESYLATE 10 MG PO TABS: 10.0000 mg | ORAL_TABLET | Freq: Every day | ORAL | 3 refills | Status: DC

## 2021-02-21 NOTE — Patient Instructions (Signed)
Ok to incresae the amlodipine to 10 mg per day  Please call in 2-3 wks if your BP is still usually more than 140/90 to consider adding a third medication such as toprol XL  Please take OTC Vitamin D3 at 2000 units per day, indefinitely  You will be contacted regarding the referral for: Renal artery ultrasound  Please continue all other medications as before, and refills have been done if requested.  Please have the pharmacy call with any other refills you may need.  Please continue your efforts at being more active, low cholesterol diet, and weight control.  Please keep your appointments with your specialists as you may have planned  Please make an Appointment to return in 6 months, or sooner if needed, also with Lab Appointment for testing done 3-5 days before at the FIRST FLOOR Lab (so this is for TWO appointments - please see the scheduling desk as you leave)  Due to the ongoing Covid 19 pandemic, our lab now requires an appointment for any labs done at our office.  If you need labs done and do not have an appointment, please call our office ahead of time to schedule before presenting to the lab for your testing.

## 2021-02-21 NOTE — Progress Notes (Signed)
Patient ID: Kyle Morse, male   DOB: October 27, 1960, 60 y.o.   MRN: [id number]        Chief Complaint: follow up HTN, low vit d, smoking       HPI:  Kyle Morse is a 60 y.o. male here overall doing ok, but home BP similar today with SBP in the 160's often.  Pt denies chest pain, increased sob or doe, wheezing, orthopnea, PND, increased LE swelling, palpitations, dizziness or syncope.   Pt denies polydipsia, polyuria, or new focal neuro s/s.   Pt denies fever, wt loss, night sweats, loss of appetite, or other constitutional symptoms  Not taking Vit D  No other new complaints        Wt Readings from Last 3 Encounters:  02/21/21 187 lb (84.8 kg)  08/21/20 185 lb (83.9 kg)  03/16/20 191 lb (86.6 kg)   BP Readings from Last 3 Encounters:  02/21/21 (!) 180/102  08/21/20 (!) 190/105  03/16/20 (!) 180/100         Past Medical History:  Diagnosis Date   ELEVATED BLOOD PRESSURE    ERECTILE DYSFUNCTION, ORGANIC    FOLLICULITIS    Lupus erythematosus    Past Surgical History:  Procedure Laterality Date   NO PAST SURGERIES      reports that he has been smoking cigars and cigarettes. He has been smoking an average of 1 pack per day. He has never used smokeless tobacco. He reports current alcohol use of about 28.0 standard drinks per week. He reports that he does not use drugs. family history includes Peptic Ulcer Disease in his mother. No Known Allergies Current Outpatient Medications on File Prior to Visit  Medication Sig Dispense Refill   hydroxychloroquine (PLAQUENIL) 200 MG tablet TAKE 1 TABLET (200 MG TOTAL) BY MOUTH 2 TIMES DAILY 180 tablet 3   Multiple Vitamin (MULTIVITAMIN) tablet Take 1 tablet by mouth daily.     olmesartan (BENICAR) 40 MG tablet Take 1 tablet (40 mg total) by mouth daily. 90 tablet 3   No current facility-administered medications on file prior to visit.        ROS:  All others reviewed and negative.  Objective        PE:  BP (!) 180/102 (BP Location: Right  Arm, Patient Position: Sitting, Cuff Size: Large)   Pulse 90   Temp 98.8 F (37.1 C) (Oral)   Ht 5' 9.5" (1.765 m)   Wt 187 lb (84.8 kg)   SpO2 96%   BMI 27.22 kg/m                 Constitutional: Pt appears in NAD               HENT: Head: NCAT.                Right Ear: External ear normal.                 Left Ear: External ear normal.                Eyes: . Pupils are equal, round, and reactive to light. Conjunctivae and EOM are normal               Nose: without d/c or deformity               Neck: Neck supple. Gross normal ROM               Cardiovascular: Normal rate and  regular rhythm.                 Pulmonary/Chest: Effort normal and breath sounds without rales or wheezing.                Abd:  Soft, NT, ND, + BS, no organomegaly               Neurological: Pt is alert. At baseline orientation, motor grossly intact               Skin: Skin is warm. No rashes, no other new lesions, LE edema - none               Psychiatric: Pt behavior is normal without agitation   Micro: none  Cardiac tracings I have personally interpreted today:  none  Pertinent Radiological findings (summarize): none   Lab Results  Component Value Date   WBC 5.8 08/21/2020   HGB 11.3 (L) 08/21/2020   HCT 33.8 (L) 08/21/2020   PLT 334.0 08/21/2020   GLUCOSE 83 08/21/2020   CHOL 126 08/21/2020   TRIG 54.0 08/21/2020   HDL 67.00 08/21/2020   LDLCALC 48 08/21/2020   ALT 12 08/21/2020   AST 21 08/21/2020   NA 134 (L) 08/21/2020   K 4.6 08/21/2020   CL 98 08/21/2020   CREATININE 1.15 08/21/2020   BUN 16 08/21/2020   CO2 29 08/21/2020   TSH 0.97 08/21/2020   PSA 0.82 08/21/2020   HGBA1C 5.3 05/30/2013   Assessment/Plan:  Kyle Morse is a 60 y.o. Other or two or more races [6] male with  has a past medical history of ELEVATED BLOOD PRESSURE, ERECTILE DYSFUNCTION, ORGANIC, FOLLICULITIS, and Lupus erythematosus.  Uncontrolled hypertension BP Readings from Last 3 Encounters:  02/21/21  (!) 180/102  08/21/20 (!) 190/105  03/16/20 (!) 180/100   Stable, pt to continue medical treatment benicar 40 but increase the amlodipine to 10 qd, cont to monitor at home, and call in 2 -3 wks if not improved to consider add toprol   Smoking Pt counseled to quit, pt not ready  Vitamin D deficiency Last vitamin D Lab Results  Component Value Date   VD25OH 20.48 (L) 08/21/2020   Low, to start oral replacement  Followup: Return in about 6 months (around 08/22/2021).  Oliver Barre, MD 02/25/2021 5:21 PM [facility] [place] [facility] [facility]

## 2021-02-25 ENCOUNTER — Encounter: Payer: Self-pay | Admitting: Internal Medicine

## 2021-02-25 NOTE — Assessment & Plan Note (Addendum)
BP Readings from Last 3 Encounters:  02/21/21 (!) 180/102  08/21/20 (!) 190/105  03/16/20 (!) 180/100   Stable, pt to continue medical treatment benicar 40 but increase the amlodipine to 10 qd, cont to monitor at home, and call in 2 -3 wks if not improved to consider add toprol;  Also will ask for renal artery u/s in a mature smoker

## 2021-02-25 NOTE — Assessment & Plan Note (Signed)
Pt counseled to quit, pt not ready 

## 2021-02-25 NOTE — Assessment & Plan Note (Signed)
Last vitamin D Lab Results  Component Value Date   VD25OH 20.48 (L) 08/21/2020   Low , to start oral replacement  

## 2021-02-25 NOTE — Addendum Note (Signed)
Addended by: Corwin Levins on: 02/25/2021 05:24 PM   Modules accepted: Orders

## 2021-03-12 ENCOUNTER — Ambulatory Visit (HOSPITAL_COMMUNITY)
Admission: RE | Admit: 2021-03-12 | Discharge: 2021-03-12 | Disposition: A | Discharge status: Home / Self Care | Payer: PRIVATE HEALTH INSURANCE | Source: Ambulatory Visit | Attending: Cardiology | Admitting: Cardiology

## 2021-03-12 ENCOUNTER — Other Ambulatory Visit: Payer: Self-pay

## 2021-03-12 DIAGNOSIS — I10 Essential (primary) hypertension: Secondary | ICD-10-CM | POA: Diagnosis not present

## 2021-03-13 ENCOUNTER — Encounter: Payer: Self-pay | Admitting: Internal Medicine

## 2021-04-04 ENCOUNTER — Other Ambulatory Visit: Payer: Self-pay | Admitting: Internal Medicine

## 2021-09-15 ENCOUNTER — Other Ambulatory Visit: Payer: Self-pay | Admitting: Internal Medicine

## 2021-09-15 NOTE — Telephone Encounter (Signed)
Please refill as per office routine med refill policy (all routine meds to be refilled for 3 mo or monthly (per pt preference) up to one year from last visit, then month to month grace period for 3 mo, then further med refills will have to be denied) ? ?

## 2023-01-03 ENCOUNTER — Ambulatory Visit: Payer: PRIVATE HEALTH INSURANCE | Admitting: Internal Medicine

## 2023-01-13 ENCOUNTER — Ambulatory Visit: Payer: BC Managed Care – PPO | Admitting: Internal Medicine

## 2023-01-13 ENCOUNTER — Other Ambulatory Visit (HOSPITAL_BASED_OUTPATIENT_CLINIC_OR_DEPARTMENT_OTHER): Payer: Self-pay

## 2023-01-13 ENCOUNTER — Encounter: Payer: Self-pay | Admitting: Internal Medicine

## 2023-01-13 ENCOUNTER — Ambulatory Visit (HOSPITAL_BASED_OUTPATIENT_CLINIC_OR_DEPARTMENT_OTHER): Payer: BC Managed Care – PPO | Admitting: Student

## 2023-01-13 ENCOUNTER — Encounter (HOSPITAL_BASED_OUTPATIENT_CLINIC_OR_DEPARTMENT_OTHER): Payer: Self-pay | Admitting: Student

## 2023-01-13 VITALS — BP 188/100 | HR 75 | Temp 97.9°F | Ht 66.5 in | Wt 185.8 lb

## 2023-01-13 DIAGNOSIS — Z0000 Encounter for general adult medical examination without abnormal findings: Secondary | ICD-10-CM

## 2023-01-13 DIAGNOSIS — Z125 Encounter for screening for malignant neoplasm of prostate: Secondary | ICD-10-CM

## 2023-01-13 DIAGNOSIS — L930 Discoid lupus erythematosus: Secondary | ICD-10-CM | POA: Diagnosis not present

## 2023-01-13 DIAGNOSIS — E538 Deficiency of other specified B group vitamins: Secondary | ICD-10-CM

## 2023-01-13 DIAGNOSIS — IMO0001 Reserved for inherently not codable concepts without codable children: Secondary | ICD-10-CM

## 2023-01-13 DIAGNOSIS — I10 Essential (primary) hypertension: Secondary | ICD-10-CM

## 2023-01-13 DIAGNOSIS — R739 Hyperglycemia, unspecified: Secondary | ICD-10-CM | POA: Diagnosis not present

## 2023-01-13 DIAGNOSIS — G5601 Carpal tunnel syndrome, right upper limb: Secondary | ICD-10-CM

## 2023-01-13 DIAGNOSIS — E559 Vitamin D deficiency, unspecified: Secondary | ICD-10-CM

## 2023-01-13 DIAGNOSIS — Z0001 Encounter for general adult medical examination with abnormal findings: Secondary | ICD-10-CM

## 2023-01-13 DIAGNOSIS — Z1211 Encounter for screening for malignant neoplasm of colon: Secondary | ICD-10-CM

## 2023-01-13 DIAGNOSIS — R011 Cardiac murmur, unspecified: Secondary | ICD-10-CM

## 2023-01-13 DIAGNOSIS — M25531 Pain in right wrist: Secondary | ICD-10-CM

## 2023-01-13 DIAGNOSIS — F17200 Nicotine dependence, unspecified, uncomplicated: Secondary | ICD-10-CM

## 2023-01-13 DIAGNOSIS — Z79899 Other long term (current) drug therapy: Secondary | ICD-10-CM

## 2023-01-13 MED ORDER — OLMESARTAN MEDOXOMIL 40 MG PO TABS: 40.0000 mg | ORAL_TABLET | Freq: Every day | ORAL | 3 refills | Status: AC

## 2023-01-13 MED ORDER — HYDROXYCHLOROQUINE SULFATE 200 MG PO TABS: ORAL_TABLET | ORAL | 1 refills | Status: DC

## 2023-01-13 MED ORDER — METHYLPREDNISOLONE 4 MG PO TBPK
ORAL_TABLET | ORAL | 0 refills | Status: DC
  Filled 2023-01-13: qty 21, 6d supply, fill #0

## 2023-01-13 MED ORDER — AMLODIPINE BESYLATE 10 MG PO TABS: 10.0000 mg | ORAL_TABLET | Freq: Every day | ORAL | 3 refills | Status: AC

## 2023-01-13 NOTE — Assessment & Plan Note (Signed)
Age and sex appropriate education and counseling updated with regular exercise and diet Referrals for preventative services - declines colonoscopy, for cologuard Immunizations addressed - declines flu shot, for tdap, shingrix at pharmacy Smoking counseling  - pt counsled to quit, pt not ready Evidence for depression or other mood disorder - none significant Most recent labs reviewed. I have personally reviewed and have noted: 1) the patient's medical and social history 2) The patient's current medications and supplements 3) The patient's height, weight, and BMI have been recorded in the chart

## 2023-01-13 NOTE — Patient Instructions (Addendum)
Please have your Shingrix (shingles) shots done at your local pharmacy, and Tdap tetanus shot  You will be contacted about the cologuard when they send the Kit to your house.  Ok to restart your medications as before  Please continue all other medications as before, and refills have been done if requested.  Please have the pharmacy call with any other refills you may need.  Please continue your efforts at being more active, low cholesterol diet, and weight control.  You are otherwise up to date with prevention measures today.  Please keep your appointments with your specialists as you may have planned  You will be contacted regarding the referral for: Dr Katrinka Blazing, pulmonary, dermatology, and opthalmology  Please go to the LAB at the blood drawing area for the tests to be done  You will be contacted by phone if any changes need to be made immediately.  Otherwise, you will receive a letter about your results with an explanation, but please check with MyChart first.  Please make an Appointment to return in 6 months, or sooner if needed

## 2023-01-13 NOTE — Assessment & Plan Note (Signed)
BP Readings from Last 3 Encounters:  01/13/23 (!) 188/100  02/21/21 (!) 180/102  08/21/20 (!) 190/105   Severe uncontrolled  pt to restart medical treatment norvasc 10 mg every day, benicar 40 every day, f/u bp at home and next visit

## 2023-01-13 NOTE — Progress Notes (Signed)
Chief Complaint: Right wrist pain     History of Present Illness:    Kyle Morse is a 62 y.o. male presenting today for evaluation of right wrist pain.  Patient reports a history of carpal tunnel syndrome for which he received an injection about 5 years ago and has gotten years of relief.  States that the symptoms began returning about a month ago, however about 3 days ago this got worse and began having swelling.  He reports that the swelling is located in the wrist and hand, and slightly into the fingers.  He does note some tingling in the fifth finger.  He typically wears a brace at night, however is in need of a replacement.  Does have history of gout in the left hand.  He works on and [employer], which requires him to have to lift heavy objects.   Surgical History:   None  PMH/PSH/Family History/Social History/Meds/Allergies:    Past Medical History:  Diagnosis Date   ELEVATED BLOOD PRESSURE    ERECTILE DYSFUNCTION, ORGANIC    FOLLICULITIS    Lupus erythematosus    Past Surgical History:  Procedure Laterality Date   NO PAST SURGERIES     Social History   Socioeconomic History   Marital status: Divorced    Spouse name: Not on file   Number of children: 0   Years of education: 41   Highest education level: Not on file  Occupational History   Occupation: Presenter, broadcasting  Tobacco Use   Smoking status: Every Day    Current packs/day: 1.00    Types: Cigars, Cigarettes   Smokeless tobacco: Never   Tobacco comments:    separated from wife since 2003. lives with mom & step dad  Substance and Sexual Activity   Alcohol use: Yes    Alcohol/week: 28.0 standard drinks of alcohol    Types: 14 Cans of beer, 14 Shots of liquor per week    Comment: Social   Drug use: No   Sexual activity: Not on file  Other Topics Concern   Not on file  Social History Narrative   Not on file   Social Determinants of Health    Financial Resource Strain: Not on file  Food Insecurity: Not on file  Transportation Needs: Not on file  Physical Activity: Not on file  Stress: Not on file  Social Connections: Not on file   Family History  Problem Relation Age of Onset   Peptic Ulcer Disease Mother    No Known Allergies Current Outpatient Medications  Medication Sig Dispense Refill   methylPREDNISolone (MEDROL DOSEPAK) 4 MG TBPK tablet Take per packet instructions 21 each 0   amLODipine (NORVASC) 10 MG tablet Take 1 tablet (10 mg total) by mouth daily. 90 tablet 3   hydroxychloroquine (PLAQUENIL) 200 MG tablet TAKE 1 TABLET BY MOUTH TWICE A DAY 180 tablet 1   Multiple Vitamin (MULTIVITAMIN) tablet Take 1 tablet by mouth daily.     olmesartan (BENICAR) 40 MG tablet Take 1 tablet (40 mg total) by mouth daily. 90 tablet 3   No current facility-administered medications for this visit.   No results found.  Review of Systems:   A ROS was performed including pertinent positives and negatives as documented in the HPI.  Physical Exam :   Constitutional:  NAD and appears stated age Neurological: Alert and oriented Psych: Appropriate affect and cooperative There were no vitals taken for this visit.   Comprehensive Musculoskeletal Exam:    Mild swelling and warmth noted over the dorsal wrist extending into the hand.  Tenderness over the soft tissues in this area, however no bony tenderness.  Negative Tinel and Phalen's test.  Slight deficit in flexion of the fifth finger with mild loss of sensation.  Active wrist range of motion from 30 degrees flexion of 30 degrees extension.  Grip strength slightly decreased compared to left side.  Imaging:     Assessment:   62 y.o. male with pain and swelling of the right wrist.  He does have reported history of carpal tunnel syndrome, however while there might be some degree of involvement, overall exam appears inconsistent with this.  He does report history of gout in the  wrist, which at this time I would be more suspicious for.  Discussed treatment options, but that I would recommend proceeding with an oral course of steroids versus performing injections, as this would likely treat any existing gout flare as well as improve any nerve irritation.  Patient is agreeable to this, so I will start him on a Medrol Dosepak.  Will also provide a brace as requested.  Will plan to follow-up as needed.  Plan :    -Start Medrol Dosepak -Return to clinic as needed     I personally saw and evaluated the patient, and participated in the management and treatment plan.  Hazle Nordmann, PA-C Orthopedics  This document was dictated using Conservation officer, historic buildings. A reasonable attempt at proof reading has been made to minimize errors.

## 2023-01-13 NOTE — Progress Notes (Signed)
Patient ID: Kyle Morse, male   DOB: 09/15/1960, 62 y.o.   MRN: [id number]         Chief Complaint:: wellness exam and htn, right CTS, discoid lupus, smoker, heart murmur       HPI:  Kyle Morse is a 62 y.o. male here for wellness exam , for tdap, shingrix at pharmacy, declines colonoscopy, for cologuard, declines flu shot, o/w up to date                        Also works changing tires, now with worsening right CTS symptoms with pain , numbness, mild weakness, was improved with cortisone injection per Dr Katrinka Blazing sport med several years ago, hoping for referral for same as he has seem suboptomal results from surgury with several co workers who could not work again after.  Pt denies chest pain, increased sob or doe, wheezing, orthopnea, PND, increased LE swelling, palpitations, dizziness or syncope.   Pt denies polydipsia, polyuria, or new focal neuro s/s.  Has been out of BP meds, Needs all meds refilled, and referrals also to dermatology for discoid lupus and plaquinil, and optho for for same.  Still smoking, not ready to quit.     Wt Readings from Last 3 Encounters:  01/13/23 185 lb 12.8 oz (84.3 kg)  02/21/21 187 lb (84.8 kg)  08/21/20 185 lb (83.9 kg)   BP Readings from Last 3 Encounters:  01/13/23 (!) 188/100  02/21/21 (!) 180/102  08/21/20 (!) 190/105   There is no immunization history on file for this patient. Health Maintenance Due  Topic Date Due   DTaP/Tdap/Td (1 - Tdap) Never done   Zoster Vaccines- Shingrix (1 of 2) Never done   Colonoscopy  Never done      Past Medical History:  Diagnosis Date   ELEVATED BLOOD PRESSURE    ERECTILE DYSFUNCTION, ORGANIC    FOLLICULITIS    Lupus erythematosus    Past Surgical History:  Procedure Laterality Date   NO PAST SURGERIES      reports that he has been smoking cigars and cigarettes. He has never used smokeless tobacco. He reports current alcohol use of about 28.0 standard drinks of alcohol per week. He reports that he  does not use drugs. family history includes Peptic Ulcer Disease in his mother. No Known Allergies Current Outpatient Medications on File Prior to Visit  Medication Sig Dispense Refill   Multiple Vitamin (MULTIVITAMIN) tablet Take 1 tablet by mouth daily.     No current facility-administered medications on file prior to visit.        ROS:  All others reviewed and negative.  Objective        PE:  BP (!) 188/100 (BP Location: Left Arm, Patient Position: Sitting, Cuff Size: Normal)   Pulse 75   Temp 97.9 F (36.6 C) (Oral)   Ht 5' 6.5" (1.689 m)   Wt 185 lb 12.8 oz (84.3 kg)   SpO2 96%   BMI 29.54 kg/m                 Constitutional: Pt appears in NAD               HENT: Head: NCAT.                Right Ear: External ear normal.                 Left Ear: External ear normal.  Eyes: . Pupils are equal, round, and reactive to light. Conjunctivae and EOM are normal               Nose: without d/c or deformity               Neck: Neck supple. Gross normal ROM               Cardiovascular: Normal rate and regular rhythm. With gr 2/6 sys murmur RUSB                Pulmonary/Chest: Effort normal and breath sounds without rales or wheezing.                Abd:  Soft, NT, ND, + BS, no organomegaly               Neurological: Pt is alert. At baseline orientation, motor grossly intact               Skin: Skin is warm. No rashes, no other new lesions, LE edema - none               Psychiatric: Pt behavior is normal without agitation   Micro: none  Cardiac tracings I have personally interpreted today:  none  Pertinent Radiological findings (summarize): none   Lab Results  Component Value Date   WBC 5.8 08/21/2020   HGB 11.3 (L) 08/21/2020   HCT 33.8 (L) 08/21/2020   PLT 334.0 08/21/2020   GLUCOSE 83 08/21/2020   CHOL 126 08/21/2020   TRIG 54.0 08/21/2020   HDL 67.00 08/21/2020   LDLCALC 48 08/21/2020   ALT 12 08/21/2020   AST 21 08/21/2020   NA 134 (L) 08/21/2020    K 4.6 08/21/2020   CL 98 08/21/2020   CREATININE 1.15 08/21/2020   BUN 16 08/21/2020   CO2 29 08/21/2020   TSH 0.97 08/21/2020   PSA 0.82 08/21/2020   HGBA1C 5.3 05/30/2013   Assessment/Plan:  Kyle Morse is a 62 y.o. Other or two or more races [6] male with  has a past medical history of ELEVATED BLOOD PRESSURE, ERECTILE DYSFUNCTION, ORGANIC, FOLLICULITIS, and Lupus erythematosus.  Discoid lupus erythematosus Ok for restart bridging plaquinil, for derm referral and optho referral on plaquinil  Encounter for well adult exam with abnormal findings Age and sex appropriate education and counseling updated with regular exercise and diet Referrals for preventative services - declines colonoscopy, for cologuard Immunizations addressed - declines flu shot, for tdap, shingrix at pharmacy Smoking counseling  - pt counsled to quit, pt not ready Evidence for depression or other mood disorder - none significant Most recent labs reviewed. I have personally reviewed and have noted: 1) the patient's medical and social history 2) The patient's current medications and supplements 3) The patient's height, weight, and BMI have been recorded in the chart   Smoking Pt counsled to quit, pt not ready; refer pulm for LDCT screening  Uncontrolled hypertension BP Readings from Last 3 Encounters:  01/13/23 (!) 188/100  02/21/21 (!) 180/102  08/21/20 (!) 190/105   Severe uncontrolled  pt to restart medical treatment norvasc 10 mg every day, benicar 40 every day, f/u bp at home and next visit   Vitamin D deficiency Last vitamin D Lab Results  Component Value Date   VD25OH 20.48 (L) 08/21/2020   Low, to start oral replacement   Carpal tunnel syndrome of right wrist Mod to severe symptpoms, for sport med referral asap  Heart murmur Noted on  exam, asympt, pt declines echo for now  Followup: Return in about 6 months (around 07/16/2023).  Oliver Barre, MD 01/13/2023 12:58 PM [facility]

## 2023-01-13 NOTE — Assessment & Plan Note (Addendum)
Pt counsled to quit, pt not ready; refer pulm for LDCT screening

## 2023-01-13 NOTE — Assessment & Plan Note (Signed)
Mod to severe symptpoms, for sport med referral asap

## 2023-01-13 NOTE — Assessment & Plan Note (Signed)
Noted on exam, asympt, pt declines echo for now

## 2023-01-13 NOTE — Assessment & Plan Note (Addendum)
Ok for restart bridging plaquinil, for derm referral and optho referral on plaquinil

## 2023-01-13 NOTE — Assessment & Plan Note (Signed)
Last vitamin D Lab Results  Component Value Date   VD25OH 20.48 (L) 08/21/2020   Low , to start oral replacement  

## 2023-03-17 ENCOUNTER — Telehealth: Payer: Self-pay | Admitting: Student

## 2023-03-17 MED ORDER — METHYLPREDNISOLONE 4 MG PO TBPK: ORAL_TABLET | ORAL | 0 refills | Status: AC

## 2023-03-17 NOTE — Telephone Encounter (Signed)
Called and advised pt. He stated understanding, I resent rx in chart to patients preferred pharmacy

## 2023-03-17 NOTE — Telephone Encounter (Signed)
Patient called asked if he can get his Rx refilled for his right hand? Patient was not sure of the name of the Rx. The number to contact patient is [phone]

## 2023-08-07 ENCOUNTER — Other Ambulatory Visit: Payer: Self-pay | Admitting: Internal Medicine

## 2023-08-07 ENCOUNTER — Other Ambulatory Visit: Payer: Self-pay
